# Patient Record
Sex: Female | Born: 1980 | Race: White | Hispanic: No | Marital: Single | Smoking: Former smoker
Health system: Southern US, Community
[De-identification: ages and names within clinical notes are randomized; demographics above are authoritative.]

## PROBLEM LIST (undated history)

## (undated) DIAGNOSIS — E079 Disorder of thyroid, unspecified: Secondary | ICD-10-CM

## (undated) DIAGNOSIS — D649 Anemia, unspecified: Secondary | ICD-10-CM

## (undated) DIAGNOSIS — J45909 Unspecified asthma, uncomplicated: Secondary | ICD-10-CM

## (undated) DIAGNOSIS — I10 Essential (primary) hypertension: Secondary | ICD-10-CM

## (undated) DIAGNOSIS — F909 Attention-deficit hyperactivity disorder, unspecified type: Secondary | ICD-10-CM

## (undated) DIAGNOSIS — T7840XA Allergy, unspecified, initial encounter: Secondary | ICD-10-CM

## (undated) HISTORY — DX: Essential (primary) hypertension: I10

## (undated) HISTORY — DX: Unspecified asthma, uncomplicated: J45.909

## (undated) HISTORY — DX: Disorder of thyroid, unspecified: E07.9

## (undated) HISTORY — PX: TONSILLECTOMY AND ADENOIDECTOMY: SHX28

## (undated) HISTORY — DX: Allergy, unspecified, initial encounter: T78.40XA

## (undated) HISTORY — DX: Anemia, unspecified: D64.9

## (undated) HISTORY — DX: Attention-deficit hyperactivity disorder, unspecified type: F90.9

---
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
## 1999-12-25 ENCOUNTER — Other Ambulatory Visit: Admission: RE | Admit: 1999-12-25 | Discharge: 1999-12-25 | Payer: Self-pay | Admitting: *Deleted

## 2001-04-01 ENCOUNTER — Other Ambulatory Visit: Admission: RE | Admit: 2001-04-01 | Discharge: 2001-04-01 | Payer: Self-pay | Admitting: *Deleted

## 2002-05-11 ENCOUNTER — Other Ambulatory Visit: Admission: RE | Admit: 2002-05-11 | Discharge: 2002-05-11 | Payer: Self-pay | Admitting: *Deleted

## 2004-08-04 ENCOUNTER — Other Ambulatory Visit: Admission: RE | Admit: 2004-08-04 | Discharge: 2004-08-04 | Payer: Self-pay | Admitting: Family Medicine

## 2005-01-24 ENCOUNTER — Encounter: Admission: RE | Admit: 2005-01-24 | Discharge: 2005-01-24 | Payer: Self-pay | Admitting: *Deleted

## 2005-10-28 ENCOUNTER — Emergency Department (HOSPITAL_COMMUNITY): Admission: EM | Admit: 2005-10-28 | Discharge: 2005-10-28 | Payer: Self-pay | Admitting: Emergency Medicine

## 2007-02-21 ENCOUNTER — Other Ambulatory Visit: Admission: RE | Admit: 2007-02-21 | Discharge: 2007-02-21 | Payer: Self-pay | Admitting: Obstetrics & Gynecology

## 2007-05-25 ENCOUNTER — Emergency Department (HOSPITAL_COMMUNITY): Admission: EM | Admit: 2007-05-25 | Discharge: 2007-05-25 | Payer: Self-pay | Admitting: Emergency Medicine

## 2007-07-21 ENCOUNTER — Emergency Department (HOSPITAL_COMMUNITY): Admission: EM | Admit: 2007-07-21 | Discharge: 2007-07-21 | Payer: Self-pay | Admitting: Emergency Medicine

## 2007-10-27 ENCOUNTER — Emergency Department (HOSPITAL_COMMUNITY): Admission: EM | Admit: 2007-10-27 | Discharge: 2007-10-27 | Payer: Self-pay | Admitting: Family Medicine
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
## 2010-04-02 ENCOUNTER — Emergency Department (HOSPITAL_COMMUNITY)
Admission: EM | Admit: 2010-04-02 | Discharge: 2010-04-02 | Payer: Self-pay | Source: Home / Self Care | Admitting: Emergency Medicine

## 2010-04-21 IMAGING — CR DG CHEST 2V
1 series · 2 of 2 positions shown · non-contrast
Comparison: NONE

CLINICAL DATA: Allergies. Asthma. 

CHEST TWO VIEW (PA AND LATERAL)

[Series 1: view not recorded · 0.17mm/px · 2 of 2 slices shown]
[im 1/2]
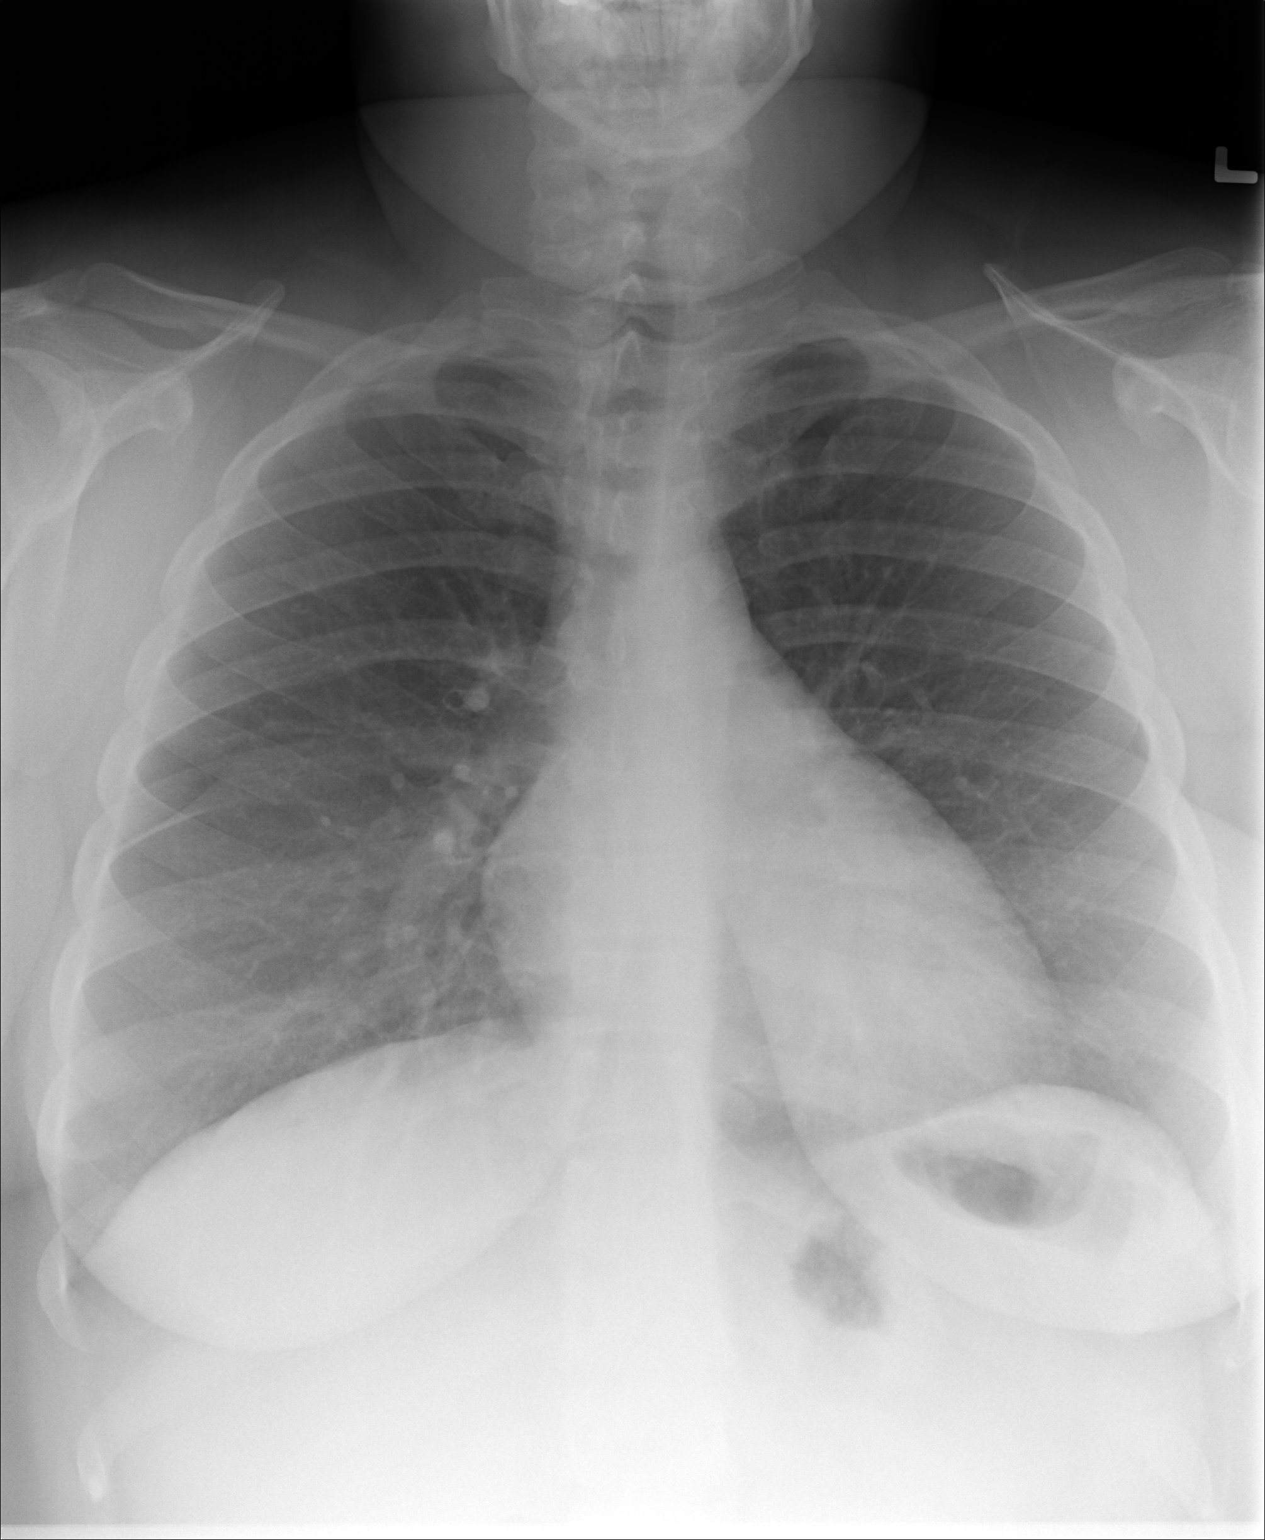
[im 2/2]
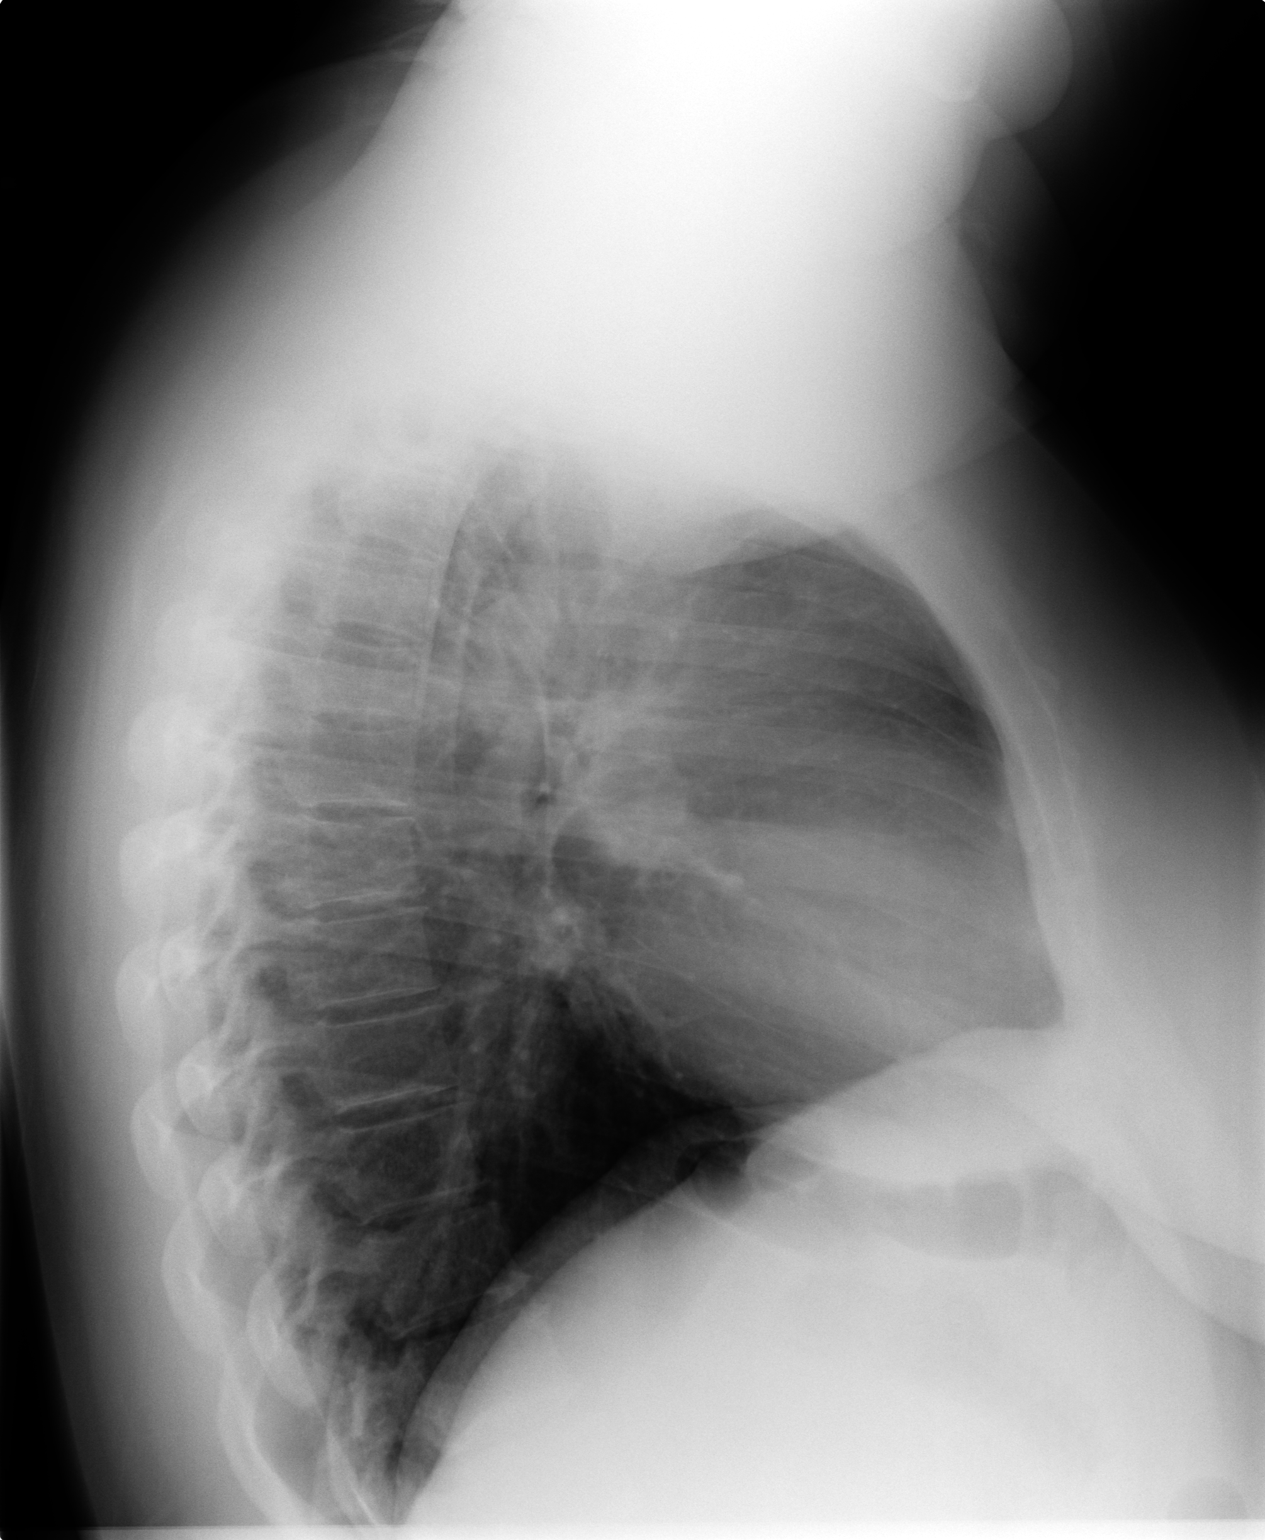

[2 of 2 positions shown; findings below may reference images not displayed]

FINDINGS: The lungs are clear and well expanded.   Heart and 
pulmonary vessels are normal.  No significant abnormalities are 
noted in the regional skeleton.
IMPRESSION: No active disease. Giti Delao M.D.

## 2010-12-29 LAB — DIFFERENTIAL
Lymphocytes Relative: 10 — ABNORMAL LOW
Lymphs Abs: 1.4
Neutro Abs: 12.1 — ABNORMAL HIGH
Neutrophils Relative %: 86 — ABNORMAL HIGH

## 2010-12-29 LAB — POCT PREGNANCY, URINE: Preg Test, Ur: NEGATIVE

## 2010-12-29 LAB — URINALYSIS, ROUTINE W REFLEX MICROSCOPIC
Bilirubin Urine: NEGATIVE
Ketones, ur: NEGATIVE
Specific Gravity, Urine: 1.005
Urobilinogen, UA: 0.2
pH: 6

## 2010-12-29 LAB — CBC
Platelets: 395
RBC: 4.18
WBC: 14.1 — ABNORMAL HIGH

## 2010-12-29 LAB — ETHANOL: Alcohol, Ethyl (B): 320 — ABNORMAL HIGH

## 2010-12-29 LAB — URINE MICROSCOPIC-ADD ON

## 2011-05-02 ENCOUNTER — Ambulatory Visit: Payer: Self-pay | Admitting: Internal Medicine

## 2011-05-11 ENCOUNTER — Telehealth: Payer: Self-pay

## 2011-05-11 NOTE — Telephone Encounter (Signed)
.  [facility]  PT IS REQUESTING REFILL ON ADDEROL 15 MG OR 20 MG WILL PICK UP TOMORROW  HAD TO RESCHEDULED THE APPT

## 2011-05-11 NOTE — Telephone Encounter (Signed)
Please tell pt to come in and see Dr. Merla Riches, cannot prescribe Adderall for her at this time, needs recheck.

## 2011-05-12 NOTE — Telephone Encounter (Signed)
Called and left message for patient to pick up at front desk. Per Dr Merla Riches Adderall 15mg  1 po q day

## 2011-05-14 ENCOUNTER — Encounter: Payer: Self-pay | Admitting: Family Medicine

## 2011-05-16 ENCOUNTER — Ambulatory Visit: Payer: Self-pay | Admitting: Internal Medicine

## 2011-05-16 ENCOUNTER — Encounter: Payer: Self-pay | Admitting: Internal Medicine

## 2011-05-16 ENCOUNTER — Ambulatory Visit (INDEPENDENT_AMBULATORY_CARE_PROVIDER_SITE_OTHER): Payer: PRIVATE HEALTH INSURANCE | Admitting: Internal Medicine

## 2011-05-16 DIAGNOSIS — F909 Attention-deficit hyperactivity disorder, unspecified type: Secondary | ICD-10-CM | POA: Insufficient documentation

## 2011-05-16 DIAGNOSIS — I10 Essential (primary) hypertension: Secondary | ICD-10-CM | POA: Insufficient documentation

## 2011-05-16 DIAGNOSIS — F988 Other specified behavioral and emotional disorders with onset usually occurring in childhood and adolescence: Secondary | ICD-10-CM

## 2011-05-16 MED ORDER — AMPHETAMINE-DEXTROAMPHET ER 20 MG PO CP24: 20.0000 mg | ORAL_CAPSULE | ORAL | Status: DC

## 2011-05-16 MED ORDER — BISOPROLOL-HYDROCHLOROTHIAZIDE 5-6.25 MG PO TABS: 1.0000 | ORAL_TABLET | Freq: Every day | ORAL | Status: DC

## 2011-05-16 MED ORDER — AMPHETAMINE-DEXTROAMPHETAMINE 15 MG PO TABS: 15.0000 mg | ORAL_TABLET | Freq: Every day | ORAL | Status: DC

## 2011-05-16 NOTE — Progress Notes (Signed)
  Subjective:    Patient ID: Emma Rojas, female    DOB: Jun 27, 1980, 31 y.o.   MRN: [id number]  HPISarah follows up for hypertension and ADD . Her blood pressures have been normal over the past 6 weeks at home. She is started back to the gym for workouts. She has lost a few pounds. She continues to take Ziac.  15 mg X. Are Adderall is no longer lasting as long as it should and she notices a waning of attention particularly in her busy job at [employer]. She has no side effects. She is now completely off Wellbutrin and doing well    Review of Systemsnoncontributory     Objective:   Physical Examstable vital signs and normal cardiovascular exam        Assessment & Plan:  Problem #1 ADD  Plan increase to Adderall 20XR for the next 3 months. If stable and responding well may call for 3 more months.  Problem #2 hypertension  Plan continue current meds and home monitoring  Problem #3 obesity  Plan continue workouts and diet

## 2011-05-17 ENCOUNTER — Other Ambulatory Visit: Payer: Self-pay | Admitting: Internal Medicine

## 2011-06-11 ENCOUNTER — Ambulatory Visit (INDEPENDENT_AMBULATORY_CARE_PROVIDER_SITE_OTHER): Payer: PRIVATE HEALTH INSURANCE | Admitting: Internal Medicine

## 2011-06-11 VITALS — BP 132/93 | HR 78 | Temp 98.6°F | Resp 16 | Ht 62.5 in | Wt 246.9 lb

## 2011-06-11 DIAGNOSIS — R05 Cough: Secondary | ICD-10-CM

## 2011-06-11 DIAGNOSIS — J9801 Acute bronchospasm: Secondary | ICD-10-CM

## 2011-06-11 DIAGNOSIS — R059 Cough, unspecified: Secondary | ICD-10-CM

## 2011-06-11 DIAGNOSIS — Z9109 Other allergy status, other than to drugs and biological substances: Secondary | ICD-10-CM

## 2011-06-11 DIAGNOSIS — Z789 Other specified health status: Secondary | ICD-10-CM

## 2011-06-11 MED ORDER — HYDROCODONE-ACETAMINOPHEN 7.5-500 MG/15ML PO SOLN: 5.0000 mL | Freq: Four times a day (QID) | ORAL | Status: AC | PRN

## 2011-06-11 MED ORDER — METHYLPREDNISOLONE ACETATE 80 MG/ML IJ SUSP
80.0000 mg | Freq: Once | INTRAMUSCULAR | Status: AC
  Administered 2011-06-11: 80 mg via INTRAMUSCULAR

## 2011-06-11 NOTE — Progress Notes (Signed)
  Subjective:    Patient ID: Emma Rojas, female    DOB: Jul 06, 1980, 31 y.o.   MRN: [id number]  HPI Cough Allergy No fever, not sick    Review of Systems     Objective:   Physical Exam Lungs clear all fields HEENT normal       Assessment & Plan:   Probable allergic cough  Lortab elixir Loratadine 10mg  Albuterol prn Depomedrol 80mg  IM

## 2011-07-04 ENCOUNTER — Telehealth: Payer: Self-pay | Admitting: *Deleted

## 2011-07-04 NOTE — Telephone Encounter (Signed)
We received a letter from pt stating that asks [place] to send a letter stating proof of prescription for Adderall XR 20mg  and it needed to be faxed to Dr Karie Fetch at [phone].  His regular phone number is [phone].  She needs it to include name, birthdate, and last four of social 63.  She needs this faxed over within the next 48 hours.  (NOTE NOT DATED AND IS IN CHART IF NEEDED)  Per Alycia Rossetti her last Rx from [place] was 04/2011.  Pt not currently receiving rx's from [place].  Please call pt to get details.  6:53pm  LMOM TO CB

## 2011-07-05 NOTE — Telephone Encounter (Signed)
There is documentation under "Meds" tab in Epic that pt has current Rxs written by Dr Merla Riches through Apr, 2013. Can we write her a letter to fax?

## 2011-07-06 NOTE — Telephone Encounter (Signed)
[facility] notifying patient that letter was faxed to number listed below.

## 2011-07-06 NOTE — Telephone Encounter (Signed)
Letter written and ready to fax. Letter at [employer].  Call pt to notify.

## 2011-08-21 ENCOUNTER — Telehealth: Payer: Self-pay

## 2011-08-21 DIAGNOSIS — F909 Attention-deficit hyperactivity disorder, unspecified type: Secondary | ICD-10-CM

## 2011-08-21 MED ORDER — AMPHETAMINE-DEXTROAMPHET ER 20 MG PO CP24: 20.0000 mg | ORAL_CAPSULE | ORAL | Status: DC

## 2011-08-21 NOTE — Telephone Encounter (Signed)
adderall printed and ready to p/up

## 2011-08-21 NOTE — Telephone Encounter (Signed)
RX ran out in April but she didn't call to renew due to no health insurance.  Now covered and would like to refill Adderall RX.  Best number is new -- [phone]

## 2011-08-22 NOTE — Telephone Encounter (Signed)
Pt notified that rx is ready for pick up

## 2011-08-24 ENCOUNTER — Telehealth: Payer: Self-pay

## 2011-08-24 NOTE — Telephone Encounter (Signed)
Pt brought printed Rx back to 102 and it is at the PA desk. Can we write the Rx for the immediate release Adderall BID?

## 2011-08-24 NOTE — Telephone Encounter (Signed)
Pt picked up rx adderall rx on the 15th and she would like to bring rx back for a rx adderall 2 per day without insurance its only $50 and the other rx is $75

## 2011-08-25 MED ORDER — AMPHETAMINE-DEXTROAMPHETAMINE 20 MG PO TABS: 20.0000 mg | ORAL_TABLET | Freq: Two times a day (BID) | ORAL | Status: DC

## 2011-08-25 NOTE — Telephone Encounter (Signed)
Original Rx brought back in from 08/21/11. New Rx issued for Adderall 20 mg bid. Original Rx shredded.

## 2011-08-25 NOTE — Telephone Encounter (Signed)
Patient notified rx in pickup drawer.

## 2011-10-16 ENCOUNTER — Telehealth: Payer: Self-pay

## 2011-10-16 NOTE — Telephone Encounter (Signed)
Pt is calling for a refill on adderol. States last time refilled was for regular adderol because she did not have health ins. Has ins now and wants to go back to the "xr" Please call pt to advise

## 2011-10-17 MED ORDER — AMPHETAMINE-DEXTROAMPHET ER 20 MG PO CP24: 20.0000 mg | ORAL_CAPSULE | ORAL | Status: DC

## 2011-10-17 NOTE — Telephone Encounter (Signed)
LMOM that received her request and it is ready for p/up

## 2011-10-17 NOTE — Telephone Encounter (Signed)
Done and printed. Changed back to Adderall 20 mg XR.

## 2011-11-14 ENCOUNTER — Telehealth: Payer: Self-pay

## 2011-11-14 MED ORDER — AMPHETAMINE-DEXTROAMPHET ER 20 MG PO CP24: 20.0000 mg | ORAL_CAPSULE | ORAL | Status: DC

## 2011-11-14 NOTE — Telephone Encounter (Signed)
Advised pt of rx ready at front desk for pick up.

## 2011-11-14 NOTE — Telephone Encounter (Signed)
Pt is requesting a written rx for adderall please call [phone] when ready to pick up

## 2011-11-14 NOTE — Telephone Encounter (Signed)
Signed and at TL desk  

## 2011-12-19 ENCOUNTER — Telehealth: Payer: Self-pay

## 2011-12-19 MED ORDER — AMPHETAMINE-DEXTROAMPHET ER 20 MG PO CP24: 20.0000 mg | ORAL_CAPSULE | ORAL | Status: DC

## 2011-12-19 NOTE — Telephone Encounter (Signed)
? 

## 2011-12-19 NOTE — Telephone Encounter (Signed)
Notified pt that Rx is ready for p/up and that she is due for f/up for add'l RFs. Pt agreed.

## 2011-12-19 NOTE — Telephone Encounter (Signed)
Pt needs refill on her adderall please call patient at [phone]

## 2011-12-19 NOTE — Telephone Encounter (Signed)
Refill done and printed. Needs OV for further refill.

## 2012-01-22 ENCOUNTER — Encounter: Payer: Self-pay | Admitting: Family Medicine

## 2012-05-09 ENCOUNTER — Other Ambulatory Visit: Payer: Self-pay | Admitting: Internal Medicine

## 2012-06-15 ENCOUNTER — Other Ambulatory Visit: Payer: Self-pay | Admitting: Internal Medicine

## 2012-07-23 ENCOUNTER — Ambulatory Visit: Payer: PRIVATE HEALTH INSURANCE | Admitting: Internal Medicine

## 2012-09-19 ENCOUNTER — Ambulatory Visit (INDEPENDENT_AMBULATORY_CARE_PROVIDER_SITE_OTHER): Payer: BC Managed Care – PPO | Admitting: Internal Medicine

## 2012-09-19 VITALS — BP 120/85 | HR 72 | Temp 98.4°F | Resp 16 | Wt 286.0 lb

## 2012-09-19 DIAGNOSIS — Z6841 Body Mass Index (BMI) 40.0 and over, adult: Secondary | ICD-10-CM

## 2012-09-19 DIAGNOSIS — F988 Other specified behavioral and emotional disorders with onset usually occurring in childhood and adolescence: Secondary | ICD-10-CM

## 2012-09-19 DIAGNOSIS — F909 Attention-deficit hyperactivity disorder, unspecified type: Secondary | ICD-10-CM

## 2012-09-19 DIAGNOSIS — I10 Essential (primary) hypertension: Secondary | ICD-10-CM

## 2012-09-19 LAB — POCT CBC
Hemoglobin: 13.5 g/dL (ref 12.2–16.2)
Lymph, poc: 2 (ref 0.6–3.4)
MCH, POC: 29.9 pg (ref 27–31.2)
MCHC: 31.2 g/dL — AB (ref 31.8–35.4)
MID (cbc): 0.7 (ref 0–0.9)
MPV: 7.7 fL (ref 0–99.8)
POC LYMPH PERCENT: 16.1 %L (ref 10–50)
POC MID %: 5.5 %M (ref 0–12)
Platelet Count, POC: 385 10*3/uL (ref 142–424)
RDW, POC: 13.6 %
WBC: 12.7 10*3/uL — AB (ref 4.6–10.2)

## 2012-09-19 MED ORDER — AMPHETAMINE-DEXTROAMPHET ER 20 MG PO CP24: 20.0000 mg | ORAL_CAPSULE | ORAL | Status: DC

## 2012-09-19 MED ORDER — BISOPROLOL-HYDROCHLOROTHIAZIDE 5-6.25 MG PO TABS: ORAL_TABLET | ORAL | Status: DC

## 2012-09-19 NOTE — Progress Notes (Signed)
  Subjective:    Patient ID: Emma Rojas, female    DOB: 26-Mar-1981, 32 y.o.   MRN: [id number]  HPIhere for f/u Long hx ADD Off meds due to anxiety they created plus job change no longer demanded focusBUT now with new job travelling w/ Rosanne [employer]ing [employer] is in need of meds again Lots of distractability and lack of organization Fatigued w/ all the travel 249 to [place] since 2/13/big appetite/no exercise  Fh-+htn  Still on ziac doing well  Review of Systems  Constitutional: Negative for fever.  Eyes: Negative for visual disturbance.  Respiratory: Negative for shortness of breath.   Cardiovascular: Negative for chest pain, palpitations and leg swelling.  Gastrointestinal: Negative for abdominal pain.  Endocrine: Negative for cold intolerance, heat intolerance, polydipsia, polyphagia and polyuria.  Genitourinary: Negative for difficulty urinating.  Neurological: Negative for weakness and headaches.  Hematological: Does not bruise/bleed easily.  Psychiatric/Behavioral: Negative for behavioral problems, sleep disturbance and dysphoric mood. The patient is not nervous/anxious.        Objective:   Physical Exam BP 120/85  Pulse 72  Temp(Src) 98.4 F (36.9 C) (Oral)  Resp 16  Wt 286 lb (129.729 kg)  BMI 51.44 kg/m2  LMP 08/29/2012 HEENT clear No thyromegaly Heart regular without murmur Extremities without edema Neurological intact Mood stable/affect appropriate       Assessment & Plan:  Hypertension - Plan: POCT CBC, Comprehensive metabolic panel, bisoprolol-hydrochlorothiazide (ZIAC) 5-6.25 MG per tablet  Attention deficit hyperactivity disorder (ADHD)  BMI 50.0-59.9, adult - Plan: POCT glycosylated hemoglobin (Hb A1C), Lipid panel, TSH  Meds ordered this encounter  Medications  . amphetamine-dextroamphetamine (ADDERALL XR) 20 MG 24 hr capsule    Sig: Take 1 capsule (20 mg total) by mouth every morning.    Dispense:  30 capsule    Refill:  0  . amphetamine-dextroamphetamine  (ADDERALL XR) 20 MG 24 hr capsule    Sig: Take 1 capsule (20 mg total) by mouth every morning. 10/19/12    Dispense:  30 capsule    Refill:  0  . amphetamine-dextroamphetamine (ADDERALL XR) 20 MG 24 hr capsule    Sig: Take 1 capsule (20 mg total) by mouth every morning. 11/19/12    Dispense:  30 capsule    Refill:  0  . bisoprolol-hydrochlorothiazide (ZIAC) 5-6.25 MG per tablet    Sig: One daily    Dispense:  90 tablet    Refill:  3   Call for side effects Send letter after lab results to establish diet and exercise Recheck 3-6 months

## 2012-09-20 LAB — COMPREHENSIVE METABOLIC PANEL
AST: 28 U/L (ref 0–37)
BUN: 10 mg/dL (ref 6–23)
Calcium: 9.8 mg/dL (ref 8.4–10.5)
Chloride: 100 mEq/L (ref 96–112)
Creat: 0.75 mg/dL (ref 0.50–1.10)

## 2012-09-20 LAB — LIPID PANEL
HDL: 57 mg/dL (ref >39)
Total CHOL/HDL Ratio: 3.7 Ratio

## 2012-09-20 LAB — TSH: TSH: 2.634 u[IU]/mL (ref 0.350–4.500)

## 2012-09-23 ENCOUNTER — Encounter: Payer: Self-pay | Admitting: Internal Medicine

## 2012-12-03 ENCOUNTER — Ambulatory Visit (INDEPENDENT_AMBULATORY_CARE_PROVIDER_SITE_OTHER): Payer: BC Managed Care – PPO | Admitting: Family Medicine

## 2012-12-03 VITALS — BP 116/72 | HR 80 | Temp 98.8°F | Resp 16 | Ht 63.5 in | Wt 281.4 lb

## 2012-12-03 DIAGNOSIS — L02219 Cutaneous abscess of trunk, unspecified: Secondary | ICD-10-CM

## 2012-12-03 DIAGNOSIS — R109 Unspecified abdominal pain: Secondary | ICD-10-CM

## 2012-12-03 MED ORDER — DOXYCYCLINE HYCLATE 100 MG PO TABS: 100.0000 mg | ORAL_TABLET | Freq: Two times a day (BID) | ORAL | Status: DC

## 2012-12-03 NOTE — Progress Notes (Signed)
Procedure Note: Verbal consent obtained.  Local anesthesia with 2 cc 2% lidocaine.  Betadine prep.  Incision with 11 blade.  Small amount purulence expressed.  Very small amount of sebaceous material also expressed.  Wound irrigated with remaining anesthetic.  Packed with 1/4 inch plain packing.  Cleansed and dressed.  Discussed wound care.  Recheck 48 hours . Pt tolerated very well.

## 2012-12-03 NOTE — Progress Notes (Signed)
32 year old woman who is in [employer] and is planning on traveling to [facility] today. He's had a couple days of progressive swelling on the right flank Tawni Pummel by redness and fluctuance. She's urged to come here today by her mother who works with Dr. Dorma Russell.  Objective: Patient has a 1 cm bullous lesion on her right flank which is red, tender, and fluctuant.  Assessment: Abscess right flank  Plan:Cellulitis and abscess of trunk - Plan: doxycycline (VIBRA-TABS) 100 MG tablet, Wound culture  Signed, Elvina Sidle, MD

## 2012-12-05 ENCOUNTER — Ambulatory Visit (INDEPENDENT_AMBULATORY_CARE_PROVIDER_SITE_OTHER): Payer: BC Managed Care – PPO | Admitting: Physician Assistant

## 2012-12-05 VITALS — BP 128/74 | HR 75 | Temp 98.6°F | Resp 18 | Ht 63.5 in | Wt 281.0 lb

## 2012-12-05 DIAGNOSIS — L02219 Cutaneous abscess of trunk, unspecified: Secondary | ICD-10-CM

## 2012-12-05 DIAGNOSIS — J14 Pneumonia due to Hemophilus influenzae: Secondary | ICD-10-CM

## 2012-12-05 LAB — WOUND CULTURE
Gram Stain: NONE SEEN
Gram Stain: NONE SEEN
Gram Stain: NONE SEEN
Organism ID, Bacteria: NO GROWTH

## 2012-12-05 NOTE — Progress Notes (Signed)
  Subjective:    Patient ID: Emma Rojas, female    DOB: [birth year], 32 y.o.   MRN: [id number]  HPI   Emma Rojas is a very pleasant 32 yr old female here for follow up on an abscess that was drained here two days ago.  Pt states she is doing well.  Did have some pain at the site yesterday but feel better today.  Doxycycline does cause some nausea, but it is not unbearable.  She is changing the dressing.  The area continues to drain    Review of Systems  Constitutional: Negative.   HENT: Negative.   Respiratory: Negative.   Cardiovascular: Negative.   Gastrointestinal: Positive for nausea.  Musculoskeletal: Negative.   Skin: Positive for wound.  Neurological: Negative.        Objective:   Physical Exam  Vitals reviewed. Constitutional: She is oriented to person, place, and time. She appears well-developed and well-nourished. No distress.  HENT:  Head: Normocephalic and atraumatic.  Eyes: Conjunctivae are normal. No scleral icterus.  Pulmonary/Chest: Effort normal.  Neurological: She is alert and oriented to person, place, and time.  Skin: Skin is warm and dry.     Healing wound at right hip/abdomen; small amount of surrounding erythema, no induration; packing removed, unable to express any drainage from the wound  Psychiatric: She has a normal mood and affect. Her behavior is normal.       Assessment & Plan:  Cellulitis and abscess of trunk   Emma Rojas is a 32 yr old female here for follow up on an abscess drained here 48 hours ago.  The area appears to be doing well.  I was unable to express any further purulence or sebaceous material from the wound.  I do not think she requires further packing.  Keep the area covered until completely healed.  Continue doxy for now - cx currently with no growth but not yet finalized.  If neg, may be able to stop doxy.  No need for further follow up unless worsening or concerns arise

## 2013-02-06 ENCOUNTER — Encounter: Payer: BC Managed Care – PPO | Admitting: Gynecology

## 2013-02-06 ENCOUNTER — Telehealth: Payer: Self-pay | Admitting: Nurse Practitioner

## 2013-02-06 ENCOUNTER — Telehealth: Payer: Self-pay | Admitting: Gynecology

## 2013-02-06 ENCOUNTER — Ambulatory Visit: Payer: BC Managed Care – PPO | Admitting: Gynecology

## 2013-02-06 NOTE — Telephone Encounter (Signed)
Patient had a work emergency and had to cancel her appt for today. Rescheduled her for 03/02/13 with patty

## 2013-02-06 NOTE — Telephone Encounter (Signed)
Patient called and had a work emergency and had to reschedule her appt for day we rescheduled her for 03/02/13 with patty.

## 2013-03-02 ENCOUNTER — Ambulatory Visit (INDEPENDENT_AMBULATORY_CARE_PROVIDER_SITE_OTHER): Payer: BC Managed Care – PPO | Admitting: Nurse Practitioner

## 2013-03-02 ENCOUNTER — Encounter: Payer: Self-pay | Admitting: Nurse Practitioner

## 2013-03-02 VITALS — BP 122/80 | HR 72 | Resp 16 | Ht 63.0 in | Wt 285.0 lb

## 2013-03-02 DIAGNOSIS — Z0000 Encounter for general adult medical examination without abnormal findings: Secondary | ICD-10-CM

## 2013-03-02 DIAGNOSIS — Z01419 Encounter for gynecological examination (general) (routine) without abnormal findings: Secondary | ICD-10-CM

## 2013-03-02 DIAGNOSIS — Z113 Encounter for screening for infections with a predominantly sexual mode of transmission: Secondary | ICD-10-CM

## 2013-03-02 LAB — POCT URINALYSIS DIPSTICK
Bilirubin, UA: NEGATIVE
Blood, UA: NEGATIVE
Glucose, UA: NEGATIVE
Leukocytes, UA: NEGATIVE
Nitrite, UA: NEGATIVE

## 2013-03-02 NOTE — Patient Instructions (Signed)

## 2013-03-02 NOTE — Progress Notes (Signed)
Patient ID: Emma Rojas, female   DOB: 01-16-1981, 32 y.o.   MRN: [id number] 32 y.o. G0P0 Single Caucasian Fe here for annual exam.  Menses now at 5 days, flow moderate to light.  Some cramps sometimes every month. migraine history but better now.  In earlier years took Ortho tri cyclen - causing elevated BP.   Currently not sexually active. Female partner is preference.  Patient's last menstrual period was 02/16/2013.          Sexually active: yes  The current method of family planning is none.    Exercising: no  The patient does not participate in regular exercise at present. Smoker:  Former, quit 5 years ago  Health Maintenance: Pap:  5-6 years ago, no history of abnormal TDaP:  UTD Gardasil: did not get. Labs: HB: 12.6 Urine: negative, pH 7.0   reports that she quit smoking about 5 years ago. She does not have any smokeless tobacco history on file. She reports that she drinks about 2.5 ounces of alcohol per week. She reports that she does not use illicit drugs.  Past Medical History  Diagnosis Date  . Hypertension   . ADD (attention deficit disorder with hyperactivity)   . Allergy   . Asthma     Past Surgical History  Procedure Laterality Date  . Tonsillectomy and adenoidectomy      Current Outpatient Prescriptions  Medication Sig Dispense Refill  . b complex vitamins tablet Take 1 tablet by mouth daily.      . bisoprolol-hydrochlorothiazide (ZIAC) 5-6.25 MG per tablet One daily  90 tablet  3   No current facility-administered medications for this visit.    Family History  Problem Relation Age of Onset  . Prostate cancer Father     ROS:  Pertinent items are noted in HPI.  Otherwise, a comprehensive ROS was negative.  Exam:   BP 122/80  Pulse 72  Resp 16  Ht 5\' 3"  (1.6 m)  Wt 285 lb (129.275 kg)  BMI 50.50 kg/m2  LMP 02/16/2013 Height: 5\' 3"  (160 cm)  Ht Readings from Last 3 Encounters:  03/02/13 5\' 3"  (1.6 m)  12/05/12 5' 3.5" (1.613 m)  12/03/12 5' 3.5"  (1.613 m)    General appearance: alert, cooperative and appears stated age Head: Normocephalic, without obvious abnormality, atraumatic Neck: no adenopathy, supple, symmetrical, trachea midline and thyroid normal to inspection and palpation Lungs: clear to auscultation bilaterally Breasts: normal appearance, no masses or tenderness Heart: regular rate and rhythm Abdomen: soft, non-tender; no masses,  no organomegaly Extremities: extremities normal, atraumatic, no cyanosis or edema Skin: Skin color, texture, turgor normal. No rashes or lesions Lymph nodes: Cervical, supraclavicular, and axillary nodes normal. No abnormal inguinal nodes palpated Neurologic: Grossly normal   Pelvic: External genitalia:  no lesions              Urethra:  normal appearing urethra with no masses, tenderness or lesions              Bartholin's and Skene's: normal                 Vagina: normal appearing vagina with normal color and discharge, no lesions              Cervix: anteverted              Pap taken: yes Bimanual Exam:  Uterus:  normal size, contour, position, consistency, mobility, non-tender  Adnexa: no mass, fullness, tenderness               Rectovaginal: Confirms               Anus:  normal sphincter tone, no lesions  A:  Well Woman with normal exam  Female partner - not currently dating  History of HTN - past use OCP caused elevated BP  History of ADD - off med's  Obese - BMI of 50.49 - no history of PCOS  P:   Pap smear as per guidelines   Follow with lab results  Counseled on breast self exam, STD prevention, adequate intake of calcium and vitamin D, diet and exercise return annually or prn  An After Visit Summary was printed and given to the patient.  Previous patient here about 5 years ago, Dr. Hyacinth Meeker

## 2013-03-03 LAB — STD PANEL: Hepatitis B Surface Ag: NEGATIVE

## 2013-03-04 LAB — IPS N GONORRHOEA AND CHLAMYDIA BY PCR

## 2013-03-04 NOTE — Progress Notes (Signed)
Encounter reviewed by Dr. Yelena Metzer Silva.  

## 2013-03-06 LAB — IPS PAP TEST WITH HPV

## 2013-03-10 ENCOUNTER — Telehealth: Payer: Self-pay | Admitting: *Deleted

## 2013-03-10 NOTE — Telephone Encounter (Signed)
I have attempted to contact this patient by phone with the following results: left message to return my call on answering machine (mobile/home). 

## 2013-03-10 NOTE — Telephone Encounter (Signed)
Message copied by Luisa Dago on Tue Mar 10, 2013  4:45 PM ------      Message from: Ria Comment R      Created: Mon Mar 09, 2013  8:56 AM       Let patient know that STD's are negative and pap is normal ------

## 2013-03-11 NOTE — Telephone Encounter (Signed)
Pt notified by Tracy Fast. 

## 2013-05-18 ENCOUNTER — Ambulatory Visit (INDEPENDENT_AMBULATORY_CARE_PROVIDER_SITE_OTHER): Payer: BC Managed Care – PPO | Admitting: Family Medicine

## 2013-05-18 VITALS — BP 130/84 | HR 96 | Temp 98.4°F | Resp 16 | Ht 63.5 in | Wt 294.0 lb

## 2013-05-18 DIAGNOSIS — J020 Streptococcal pharyngitis: Secondary | ICD-10-CM

## 2013-05-18 DIAGNOSIS — J029 Acute pharyngitis, unspecified: Secondary | ICD-10-CM

## 2013-05-18 DIAGNOSIS — R509 Fever, unspecified: Secondary | ICD-10-CM

## 2013-05-18 LAB — POCT INFLUENZA A/B
Influenza A, POC: NEGATIVE
Influenza B, POC: NEGATIVE

## 2013-05-18 LAB — POCT RAPID STREP A (OFFICE): Rapid Strep A Screen: POSITIVE — AB

## 2013-05-18 MED ORDER — CEFDINIR 300 MG PO CAPS: 300.0000 mg | ORAL_CAPSULE | Freq: Two times a day (BID) | ORAL | Status: DC

## 2013-05-18 MED ORDER — HYDROCODONE-HOMATROPINE 5-1.5 MG/5ML PO SYRP: 5.0000 mL | ORAL_SOLUTION | Freq: Three times a day (TID) | ORAL | Status: DC | PRN

## 2013-05-18 NOTE — Patient Instructions (Signed)

## 2013-05-18 NOTE — Progress Notes (Signed)
Chief Complaint:  Chief Complaint  Patient presents with  . Sore Throat    PND symptoms since 2 am  . Cough    HPI: Emma Rojas is a 33 y.o. female who is here for congestion and sore throat started last night. Hurts to drink water. Some fever last with chills and sweats. Did not take temperature to confirm fever. Has not taken any otc medications. Where her nose and throat hurts. Had fevers and chills. Ears feels itchy,  Minimal facial pain. Did not get flu vaccine.  Was SOb walking upstaurs but no wheezing. Cough , stuck in throat.  Had fever and chills last night, tried otc meds without releif  Past Medical History  Diagnosis Date  . Hypertension   . ADD (attention deficit disorder with hyperactivity)   . Allergy   . Asthma    Past Surgical History  Procedure Laterality Date  . Tonsillectomy and adenoidectomy     History   Social History  . Marital Status: Single    Spouse Name: N/A    Number of Children: 0  . Years of Education: N/A   Occupational History  .     Social History Main Topics  . Smoking status: Former Smoker    Quit date: 03/02/2008  . Smokeless tobacco: Not on file  . Alcohol Use: 2.5 oz/week    5 drink(s) per week  . Drug Use: No  . Sexual Activity: Yes    Partners: Female   Other Topics Concern  . Not on file   Social History Narrative  . No narrative on file   Family History  Problem Relation Age of Onset  . Prostate cancer Father    Allergies  Allergen Reactions  . Amoxicillin Hives   Prior to Admission medications   Medication Sig Start Date End Date Taking? Authorizing Provider  b complex vitamins tablet Take 1 tablet by mouth daily.   Yes Historical Provider, MD  bisoprolol-hydrochlorothiazide [facility]) 5-6.25 MG per tablet One daily 09/19/12  Yes Tonye Pearson, MD     ROS: The patient denies  night sweats, unintentional weight loss, chest pain, palpitations, wheezing, dyspnea on exertion, nausea, vomiting,  abdominal pain, dysuria, hematuria, melena, numbness, weakness, or tingling.   All other systems have been reviewed and were otherwise negative with the exception of those mentioned in the HPI and as above.    PHYSICAL EXAM: Filed Vitals:   05/18/13 0842  BP: 130/84  Pulse: 96  Temp: 98.4 F (36.9 C)  Resp: 16   Filed Vitals:   05/18/13 0842  Height: 5' 3.5" (1.613 m)  Weight: 294 lb (133.358 kg)   Body mass index is 51.26 kg/(m^2).  General: Alert, no acute distress HEENT:  Normocephalic, atraumatic, oropharynx patent. EOMI, PERRLA, erythematous throat, uvula slightly edematous, no tonsils.  Cardiovascular:  Regular rate and rhythm, no rubs murmurs or gallops.  No Carotid bruits, radial pulse intact. No pedal edema.  Respiratory: Clear to auscultation bilaterally.  No wheezes, rales, or rhonchi.  No cyanosis, no use of accessory musculature GI: No organomegaly, abdomen is soft and non-tender, positive bowel sounds.  No masses. Skin: No rashes. Neurologic: Facial musculature symmetric. Psychiatric: Patient is appropriate throughout our interaction. Lymphatic: No cervical lymphadenopathy Musculoskeletal: Gait intact.   LABS: Results for orders placed in visit on 05/18/13  POCT RAPID STREP A (OFFICE)      Result Value Range   Rapid Strep A Screen Positive (*) Negative  POCT  INFLUENZA A/B      Result Value Range   Influenza A, POC Negative     Influenza B, POC Negative       EKG/XRAY:   [facility]   ASSESSMENT/PLAN: Encounter Diagnoses  Name Primary?  . Fever Yes  . Sore throat   . Acute pharyngitis   . Strep pharyngitis     Rx Omnicef Rx hycodan F/u prn Gross sideeffects, risk and benefits, and alternatives of medications d/w patient. Patient is aware that all medications have potential sideeffects and we are unable to predict every sideeffect or drug-drug interaction that may occur.  LE, THAO PHUONG, DO 05/18/2013 10:54  AM

## 2013-07-24 ENCOUNTER — Encounter: Payer: Self-pay | Admitting: Internal Medicine

## 2013-07-24 ENCOUNTER — Ambulatory Visit (INDEPENDENT_AMBULATORY_CARE_PROVIDER_SITE_OTHER): Payer: BC Managed Care – PPO | Admitting: Internal Medicine

## 2013-07-24 VITALS — BP 118/72 | HR 70 | Temp 98.1°F | Resp 16 | Ht 63.25 in | Wt 290.8 lb

## 2013-07-24 DIAGNOSIS — F909 Attention-deficit hyperactivity disorder, unspecified type: Secondary | ICD-10-CM

## 2013-07-24 DIAGNOSIS — I10 Essential (primary) hypertension: Secondary | ICD-10-CM

## 2013-07-24 DIAGNOSIS — Z6841 Body Mass Index (BMI) 40.0 and over, adult: Secondary | ICD-10-CM

## 2013-07-24 MED ORDER — LISDEXAMFETAMINE DIMESYLATE 50 MG PO CAPS: 50.0000 mg | ORAL_CAPSULE | Freq: Every day | ORAL | Status: DC

## 2013-07-24 MED ORDER — LISDEXAMFETAMINE DIMESYLATE 50 MG PO CAPS
50.0000 mg | ORAL_CAPSULE | Freq: Every day | ORAL | Status: DC
Start: 2013-07-24 — End: 2013-11-11

## 2013-07-24 NOTE — Progress Notes (Signed)
This chart was scribed for [employer] P. Merla Richesoolittle, MD by Beverly MilchJ Harrison Collins, ED Scribe. This patient was seen in room 14 and the patient's care was started at 9:01 AM.  Subjective:    Patient ID: Emma Rojas, female    DOB: 1980-11-17, 33 y.o.   MRN: [id number]  HPI HPI Comments: Emma Rojas is a 33 y.o. female who presents to the [facility] for a med refill. She states she stopped taking her adderall because she believe she was on too high of a dose. She states she was on the 20 mg adderall XR. She reports being jittery and feeling as if "she had no personality." She states she's stopped drinking coffee, but does drink a small amount of tea. Pt also reports she would like to consider Vyvanse for her ADHD.   Pt reports her blood pressure has been great. She states it was good today (118/20 self reported). Pt reports she stopped taking her BP meds because she didn't feel as though she needed them anymore with her current diet and exercise regimen. She states she started taking kickboxing classes to get into shape about a month ago and would like to manage her high BP without meds.    Review of Systems  Constitutional: Negative for activity change, appetite change and unexpected weight change.  Musculoskeletal: Negative for arthralgias and myalgias.  Neurological: Negative for headaches.  All other systems reviewed and are negative.      Objective:   Physical Exam  Nursing note and vitals reviewed. Constitutional: She is oriented to person, place, and time. She appears well-developed and well-nourished. No distress.  HENT:  Head: Normocephalic and atraumatic.  Eyes: EOM are normal. Pupils are equal, round, and reactive to light. Right eye exhibits no discharge. Left eye exhibits no discharge. No scleral icterus.  Cardiovascular: Normal rate, regular rhythm and normal heart sounds.  Exam reveals no gallop and no friction rub.   No murmur heard. Pulmonary/Chest:  Effort normal.  Musculoskeletal: She exhibits no edema.  Neurological: She is alert and oriented to person, place, and time. No cranial nerve deficit (no gross deficits).  Skin: Skin is warm and dry.  Psychiatric: She has a normal mood and affect. Her behavior is normal.   Filed Vitals:   07/24/13 0828  BP: 118/72  Pulse: 70  Temp: 98.1 F (36.7 C)  TempSrc: Oral  Resp: 16  Height: 5' 3.25" (1.607 m)  Weight: 290 lb 12.8 oz (131.906 kg)  SpO2: 99%        Assessment & Plan:   DIAGNOSTIC STUDIES: Oxygen Saturation is 99% on RA, normal by my interpretation.    COORDINATION OF CARE: Pt advised of plan for treatment and pt agrees.  Hypertension-still may be due to meds vs wt  Attention deficit hyperactivity disorder (ADHD)--restart meds w/ vyv 50--my chart for f/u  BMI 50.0-59.9, adult-kickbox!  Meds ordered this encounter  Medications  . lisdexamfetamine (VYVANSE) 50 MG capsule    Sig: Take 1 capsule (50 mg total) by mouth daily.    Dispense:  30 capsule    Refill:  0  . lisdexamfetamine (VYVANSE) 50 MG capsule    Sig: Take 1 capsule (50 mg total) by mouth daily. For 30 d after date signed    Dispense:  30 capsule    Refill:  0  . lisdexamfetamine (VYVANSE) 50 MG capsule    Sig: Take 1 capsule (50 mg total) by mouth daily. For 60 d  after date signed    Dispense:  30 capsule    Refill:  0    I have completed the patient encounter in its entirety as documented by the scribe, with editing by me where necessary. Marleny Faller P. Merla Richesoolittle, M.D.

## 2013-10-06 ENCOUNTER — Other Ambulatory Visit: Payer: Self-pay | Admitting: Internal Medicine

## 2013-10-23 ENCOUNTER — Telehealth: Payer: Self-pay

## 2013-10-23 MED ORDER — LISDEXAMFETAMINE DIMESYLATE 50 MG PO CAPS: 50.0000 mg | ORAL_CAPSULE | Freq: Every day | ORAL | Status: DC

## 2013-10-23 NOTE — Telephone Encounter (Signed)
Dr. Merla Richesoolittle  lisdexamfetamine (VYVANSE) 50 MG capsule  refill  [phone]

## 2013-10-23 NOTE — Telephone Encounter (Signed)
Rx printed. Patient needs OV with Dr. Merla Richesoolittle for additional prescriptions this med.  Meds ordered this encounter  Medications  . lisdexamfetamine (VYVANSE) 50 MG capsule    Sig: Take 1 capsule (50 mg total) by mouth daily.    Dispense:  30 capsule    Refill:  0    Order Specific Question:  Supervising Provider    Answer:  DOOLITTLE, ROBERT P [[id number]]

## 2013-10-24 NOTE — Telephone Encounter (Signed)
Left message on machine to call back. Rx up front for p/u 

## 2013-10-25 NOTE — Telephone Encounter (Signed)
Pt.notified

## 2013-11-11 ENCOUNTER — Telehealth: Payer: Self-pay | Admitting: Nurse Practitioner

## 2013-11-11 ENCOUNTER — Ambulatory Visit (INDEPENDENT_AMBULATORY_CARE_PROVIDER_SITE_OTHER): Payer: BC Managed Care – PPO | Admitting: Certified Nurse Midwife

## 2013-11-11 ENCOUNTER — Encounter: Payer: Self-pay | Admitting: Certified Nurse Midwife

## 2013-11-11 VITALS — BP 94/60 | HR 64 | Resp 16 | Ht 63.0 in | Wt 275.0 lb

## 2013-11-11 DIAGNOSIS — N925 Other specified irregular menstruation: Secondary | ICD-10-CM

## 2013-11-11 DIAGNOSIS — N920 Excessive and frequent menstruation with regular cycle: Secondary | ICD-10-CM

## 2013-11-11 DIAGNOSIS — N949 Unspecified condition associated with female genital organs and menstrual cycle: Secondary | ICD-10-CM

## 2013-11-11 DIAGNOSIS — N938 Other specified abnormal uterine and vaginal bleeding: Secondary | ICD-10-CM

## 2013-11-11 LAB — CBC
HCT: 35.7 % — ABNORMAL LOW (ref 36.0–46.0)
HEMOGLOBIN: 12.2 g/dL (ref 12.0–15.0)
MCH: 30.6 pg (ref 26.0–34.0)
MCHC: 34.2 g/dL (ref 30.0–36.0)
MCV: 89.5 fL (ref 78.0–100.0)
Platelets: 355 10*3/uL (ref 150–400)
RBC: 3.99 MIL/uL (ref 3.87–5.11)
RDW: 14.7 % (ref 11.5–15.5)
WBC: 11.2 10*3/uL — ABNORMAL HIGH (ref 4.0–10.5)

## 2013-11-11 LAB — HEMOGLOBIN, FINGERSTICK: HEMOGLOBIN, FINGERSTICK: 11.3 g/dL — AB (ref 12.0–16.0)

## 2013-11-11 NOTE — Telephone Encounter (Signed)
Spoke with patient. Patient states that she started her cycle 7/25 and has not stopped bleeding. Cycle was "normal" for the first week but has gotten heavier since. Patient states her cycle usually lasts 4-6 days. Woke up three times in the night having to change tampon. "It has been super heavy since Friday or Saturday and seems to be getting heavier." Patient is having abdominal cramping and has taken 4 ibuprofen today. Patient is not currently on birth control. Only new change in medication is taking Vyvanse 3 months ago. Advised patient that she will need to come in to be seen for evaluation. Patient is agreeable and states that she is in town today. Appointment scheduled for today at 11:15am with Emma Rojas CNM. Patient agreeable to date and time.  Routing to Emma Rojas CNM  Cc: Emma FranklinPatricia Rolen-Grubb, FNP   Routing to provider for final review. Patient agreeable to disposition. Will close encounter

## 2013-11-11 NOTE — Progress Notes (Signed)
33 y.o. Single Caucasian female G0P0 with LMP 10/31/13 here complaining of bleeding continued for two weeks after onset with period.  Contraception is none needed due to female relationship.patient still bleeding today and used 3 tampons from 11 pm to 6 am. Has not soaked a pad an hour since onset only used tampons. Patient took Motrin yesterday and noted the bleeding decreased some, but did not repeat. She has never had this to occur, normal periods with only moderate to heavy flow with 4-5 day duration. Denies dizziness or faintness."Feels fine".   Weight:  275 POCT: Hgb- 11.3  O: Healthy WN WD female   Alert oriented x3 Skin:warm and dry Thyroid:normal to inspection and palpation and non-palpable Pelvic exam: external genital exam normal, no lesions BUS negative Vagina: small amount of blood noted in vagina Cervix: Non tender, normal, small amount of dark blood noted from cervix Uterus: mid position, non tender, soft Adnexa: non tender, no masses(large palpated), due to body habitus  A:Normal Pelvic exam 2-DUB 3-Morbid Obesity    P:Reviewed findings of normal pelvic exam and bleeding noted. Discussed weight and thyroid which can interfere with normal cycle. Discussed Motrin use to help with decreasing bleeding. Instructed to start 800 mg every 8 hours for the next 48 hours with food and advise if bleeding increases or has decreased. Warning signs of excessive bleeding given. Start on OTC counter iron and increase iron in diet. Consulted with Dr. Farrel GobbleLathrop who recommends above also and PUS with endo bx order in. Discussed recommendation with patient and agreeable. Patient informed insurance will be called to her and then will be scheduled. Voiced understanding. Lab: TSH,CBC  Rv prn as above

## 2013-11-11 NOTE — Telephone Encounter (Signed)
Patient calling to speak with the nurse about "having a cycle for the last two weeks." She said this is not normal for her and wants to speak with the nurse to see if she needs an appointment.

## 2013-11-11 NOTE — Patient Instructions (Signed)

## 2013-11-12 LAB — TSH: TSH: 5.458 u[IU]/mL — ABNORMAL HIGH (ref 0.350–4.500)

## 2013-11-13 NOTE — Progress Notes (Signed)
Note reviewed, agree with plan.  Nelsie Domino, MD  

## 2013-11-16 ENCOUNTER — Telehealth: Payer: Self-pay | Admitting: Gynecology

## 2013-11-16 DIAGNOSIS — N938 Other specified abnormal uterine and vaginal bleeding: Secondary | ICD-10-CM

## 2013-11-16 NOTE — Telephone Encounter (Signed)
Patient returned call.  She is agreeable to pelvic ultrasound tomorrow with Dr. Farrel GobbleLathrop, possible EMB.  Scheduled for 1400 with consult at 1430.  Routing to provider for final review. Patient agreeable to disposition. Will close encounter   cc Cathrine MusterSabrina Franklin

## 2013-11-16 NOTE — Telephone Encounter (Signed)
Attempted call to patient.  Unable to leave voice mail.  Mychart message sent to call our office at her earliest Convenience .

## 2013-11-16 NOTE — Telephone Encounter (Signed)
Called patient to schedule PUS/EMB because per note, Farrel GobbleLathrop wants her to come in tomorrow...call went to voicemail...mailbox is full Pr $20

## 2013-11-17 ENCOUNTER — Ambulatory Visit (INDEPENDENT_AMBULATORY_CARE_PROVIDER_SITE_OTHER): Payer: BC Managed Care – PPO

## 2013-11-17 ENCOUNTER — Ambulatory Visit (INDEPENDENT_AMBULATORY_CARE_PROVIDER_SITE_OTHER): Payer: BC Managed Care – PPO | Admitting: Gynecology

## 2013-11-17 VITALS — BP 114/72 | Resp 18 | Ht 63.0 in | Wt 278.0 lb

## 2013-11-17 DIAGNOSIS — N938 Other specified abnormal uterine and vaginal bleeding: Secondary | ICD-10-CM

## 2013-11-17 DIAGNOSIS — D649 Anemia, unspecified: Secondary | ICD-10-CM

## 2013-11-17 DIAGNOSIS — N949 Unspecified condition associated with female genital organs and menstrual cycle: Secondary | ICD-10-CM

## 2013-11-17 DIAGNOSIS — N925 Other specified irregular menstruation: Secondary | ICD-10-CM

## 2013-11-17 DIAGNOSIS — E039 Hypothyroidism, unspecified: Secondary | ICD-10-CM

## 2013-11-17 MED ORDER — LEVOTHYROXINE SODIUM 50 MCG PO TABS: 50.0000 ug | ORAL_TABLET | Freq: Every day | ORAL | Status: DC

## 2013-11-17 NOTE — Progress Notes (Signed)
       Pt here for PUS due to single episode of DUB and limited exam due to obesity.   Her images were reviewed with her, her uterus in normal size and shape.  Her EMS is symmetrical, trilayered. ovaries with follicles.  In addition, reviewed her recent labs, her TSH was noted to be elevated 5.5 and her Hb a little low 11.3.  Discussed impact of TSH on FSH and bleeding.  Pt does report fatigue, difficulty concentrating as well.    Reviewed her past menstrual history, one time episdoe of irregular bleeding, will defer EMB at this time but did inform pt if her bleeding does not improve after thyroid is corrected, would consider bx and she is agreeable. Synthroid 50mcg sent in, instructed on use Repeat TSH in 6w Will repeat CBC at that time as well. Questions addressed  6341m spent counseling, >50% face to face

## 2013-11-23 ENCOUNTER — Telehealth: Payer: Self-pay

## 2013-11-23 MED ORDER — LISDEXAMFETAMINE DIMESYLATE 50 MG PO CAPS
50.0000 mg | ORAL_CAPSULE | Freq: Every day | ORAL | Status: DC
Start: 2013-11-23 — End: 2013-12-28

## 2013-11-23 NOTE — Telephone Encounter (Signed)
PT IN NEED OF HER YXVANSE, DIDN'T KNOW SHE HAD RUN OUT AND WILL BE TRYING TO MAKE AN APPT TO SEE DR Sherin QuarryOLITTLE SOON PLEASE CALL [phone]

## 2013-11-24 NOTE — Telephone Encounter (Signed)
i printed 8/17 did it get signed???

## 2013-11-26 NOTE — Telephone Encounter (Signed)
Pt picked up on 8/18

## 2013-12-24 ENCOUNTER — Telehealth: Payer: Self-pay

## 2013-12-24 NOTE — Telephone Encounter (Signed)
Pt of Dr. Merla Riches needs refill on lisdexamfetamine (VYVANSE) 50 MG capsule [[id number]], advised to allow 24-72 hours for pick up call

## 2013-12-28 MED ORDER — LISDEXAMFETAMINE DIMESYLATE 50 MG PO CAPS: 50.0000 mg | ORAL_CAPSULE | Freq: Every day | ORAL | Status: DC

## 2013-12-28 NOTE — Telephone Encounter (Signed)
Meds ordered this encounter  Medications   lisdexamfetamine (VYVANSE) 50 MG capsule    Sig: Take 1 capsule (50 mg total) by mouth daily.    Dispense:  30 capsule    Refill:  0    

## 2013-12-28 NOTE — Telephone Encounter (Signed)
LMOM that rx is ready for p/u 

## 2014-01-04 ENCOUNTER — Telehealth: Payer: Self-pay

## 2014-01-04 DIAGNOSIS — E039 Hypothyroidism, unspecified: Secondary | ICD-10-CM

## 2014-01-04 MED ORDER — LEVOTHYROXINE SODIUM 50 MCG PO TABS: 50.0000 ug | ORAL_TABLET | Freq: Every day | ORAL | Status: DC

## 2014-01-04 NOTE — Telephone Encounter (Signed)
Last AEX: 03/02/13 Last refill:11/17/13 #50 X 0 Current AEX:03/09/14  Calling pt to make lab appt for TSH recheck per Dr. Farrel Gobble

## 2014-01-04 NOTE — Telephone Encounter (Signed)
S/W pt. Pt has been out of town the month of September. She stated she will be back on Friday but she only has 2 pills left of her synthroid. Made Lab appt for Jan 08, 2014 @ 11:00. Also sent a 2 week supply until her results come in. Pt voiced understanding and agreed.   Routed to provider for final review

## 2014-01-08 ENCOUNTER — Other Ambulatory Visit (INDEPENDENT_AMBULATORY_CARE_PROVIDER_SITE_OTHER): Payer: BC Managed Care – PPO

## 2014-01-08 DIAGNOSIS — D649 Anemia, unspecified: Secondary | ICD-10-CM

## 2014-01-08 DIAGNOSIS — E039 Hypothyroidism, unspecified: Secondary | ICD-10-CM

## 2014-01-08 LAB — CBC
HCT: 37.7 % (ref 36.0–46.0)
Hemoglobin: 12.6 g/dL (ref 12.0–15.0)
MCH: 30.3 pg (ref 26.0–34.0)
MCHC: 33.4 g/dL (ref 30.0–36.0)
MCV: 90.6 fL (ref 78.0–100.0)
PLATELETS: 374 10*3/uL (ref 150–400)
RBC: 4.16 MIL/uL (ref 3.87–5.11)
RDW: 14.6 % (ref 11.5–15.5)
WBC: 10 10*3/uL (ref 4.0–10.5)

## 2014-01-09 LAB — TSH: TSH: 2.54 u[IU]/mL (ref 0.350–4.500)

## 2014-01-11 ENCOUNTER — Telehealth: Payer: Self-pay

## 2014-01-11 DIAGNOSIS — E038 Other specified hypothyroidism: Secondary | ICD-10-CM

## 2014-01-11 NOTE — Telephone Encounter (Signed)
Left message to call Kaitlyn at [phone]. 

## 2014-01-11 NOTE — Telephone Encounter (Signed)
Message copied by Jannet AskewHINES, Yeshaya Vath E on Mon Jan 11, 2014  9:35 AM ------      Message from: Douglass RiversLATHROP, TRACY      Created: Mon Jan 11, 2014  8:24 AM       TSH is better, pls ask about her bleeding, CBC looks great, WBC is normal ------

## 2014-01-11 NOTE — Telephone Encounter (Signed)
Spoke with patient. Advised of results as seen below from Dr.Lathrop. Patient is agreeable and would like refills for synthroid called into [facility] on file. Patient would also like to know when she needs to come back in for follow up. States that her bleeding is "back to normal." Advised would check with Dr.Lathrop about follow up and getting rx sent in and give patient a call back. Patient is agreeable.

## 2014-01-12 MED ORDER — LEVOTHYROXINE SODIUM 50 MCG PO TABS: 50.0000 ug | ORAL_TABLET | Freq: Every day | ORAL | Status: DC

## 2014-01-12 NOTE — Telephone Encounter (Signed)
GREAT! RX SENT

## 2014-01-28 ENCOUNTER — Telehealth: Payer: Self-pay

## 2014-01-28 NOTE — Telephone Encounter (Signed)
Pt of Dr. Merla Richesoolittle t is requesting refill on vyvance

## 2014-01-29 MED ORDER — LISDEXAMFETAMINE DIMESYLATE 50 MG PO CAPS: 50.0000 mg | ORAL_CAPSULE | Freq: Every day | ORAL | Status: DC

## 2014-01-31 NOTE — Telephone Encounter (Signed)
Left message on machine that rx is ready for pickup and that she needs f/u next month

## 2014-02-06 ENCOUNTER — Other Ambulatory Visit: Payer: Self-pay | Admitting: Internal Medicine

## 2014-02-07 NOTE — Telephone Encounter (Signed)
Called in.

## 2014-03-09 ENCOUNTER — Ambulatory Visit: Payer: BC Managed Care – PPO | Admitting: Nurse Practitioner

## 2014-03-09 ENCOUNTER — Encounter: Payer: Self-pay | Admitting: Nurse Practitioner

## 2014-03-10 ENCOUNTER — Telehealth: Payer: Self-pay

## 2014-03-10 NOTE — Telephone Encounter (Signed)
Pt states she would like to make an appt with DR. DOOLITTLE sometime but would like to have her VYVANSE 50 MGS. Please call [phone]

## 2014-03-12 MED ORDER — LISDEXAMFETAMINE DIMESYLATE 50 MG PO CAPS: 50.0000 mg | ORAL_CAPSULE | Freq: Every day | ORAL | Status: DC

## 2014-03-12 NOTE — Telephone Encounter (Signed)
Spoke to pt; informed her rx is ready for pick up.

## 2014-03-17 ENCOUNTER — Other Ambulatory Visit: Payer: Self-pay | Admitting: Internal Medicine

## 2014-04-13 ENCOUNTER — Telehealth: Payer: Self-pay

## 2014-04-13 NOTE — Telephone Encounter (Signed)
Pt has not been seen since 07/2013. Advised she would need to come in for an office visit for refills. Pt was given Dr. Netta Corriganoolittle's schedule at the walk in clinic. Pt is going to come in Monday.

## 2014-04-13 NOTE — Telephone Encounter (Signed)
PATIENT WOULD LIKE DR. DOOLITTLE TO KNOW THAT SHE TOOK HER LAST VYVANSE 50 MG THIS MORNING. SHE SAID SHE HAS NOT BEEN ABLE TO GET AN APPOINTMENT WITH HIM. SHE NEEDS TO GET A PRESCRIPTION WRITTEN. PLEASE CALL HER WHEN SHE CAN PICK IT UP. BEST PHONE [phone] (CELL)  MBC

## 2014-04-19 ENCOUNTER — Other Ambulatory Visit: Payer: Self-pay | Admitting: Internal Medicine

## 2014-04-20 ENCOUNTER — Ambulatory Visit (INDEPENDENT_AMBULATORY_CARE_PROVIDER_SITE_OTHER): Payer: BLUE CROSS/BLUE SHIELD | Admitting: Internal Medicine

## 2014-04-20 VITALS — BP 120/80 | HR 74 | Temp 98.5°F | Resp 18 | Ht 63.5 in | Wt 265.0 lb

## 2014-04-20 DIAGNOSIS — F900 Attention-deficit hyperactivity disorder, predominantly inattentive type: Secondary | ICD-10-CM

## 2014-04-20 DIAGNOSIS — E039 Hypothyroidism, unspecified: Secondary | ICD-10-CM

## 2014-04-20 DIAGNOSIS — Z6841 Body Mass Index (BMI) 40.0 and over, adult: Secondary | ICD-10-CM

## 2014-04-20 MED ORDER — LISDEXAMFETAMINE DIMESYLATE 60 MG PO CAPS: 60.0000 mg | ORAL_CAPSULE | Freq: Every day | ORAL | Status: DC

## 2014-04-20 MED ORDER — LISDEXAMFETAMINE DIMESYLATE 60 MG PO CAPS: 60.0000 mg | ORAL_CAPSULE | ORAL | Status: DC

## 2014-04-20 MED ORDER — BISOPROLOL-HYDROCHLOROTHIAZIDE 5-6.25 MG PO TABS: 1.0000 | ORAL_TABLET | Freq: Every day | ORAL | Status: DC

## 2014-04-20 NOTE — Progress Notes (Signed)
Subjective:  This chart was scribed for Emma Siaobert Samanta Gal, MD by Emma Rojas, Medical Scribe. This patient was seen in Room 4 and the patient's care was started at 4:11 PM.   Patient ID: Emma Rojas, female    DOB: 1981/03/07, 34 y.o.   MRN: [id number]  Chief Complaint  Patient presents with  . rx refills    ziac, levo, vyvanse    HPI HPI Comments: Emma Rojas is a 34 y.o. female who presents to the [facility] for a refill of her Zaic, Levothroid, and Vyvanse medication. Pt states she has been doing well on her medications with no complaints. Pt had normal blood work this past fall after getting put on a thyroid medication. Pt has been exercising with a personal trainer and has lost about 15 pounds since this began. Pt thinks that the exercise and weight loss has been helping to manage her BP along with the medication. Pt would like to work on getting her BP down to where she no longer needs to use the medication. She denies any mood swings or emotional symptoms from the medications.   Current vyv 50 wears off 4pm---no side eff  Past Medical History  Diagnosis Date  . Hypertension   . ADD (attention deficit disorder with hyperactivity)   . Allergy   . Asthma   . Anemia   . Thyroid disease    Allergies  Allergen Reactions  . Amoxicillin Hives   Current Outpatient Prescriptions on File Prior to Visit  Medication Sig Dispense Refill  . bisoprolol-hydrochlorothiazide (ZIAC) 5-6.25 MG per tablet Take 1 tablet by mouth daily. PATIENT NEEDS OFFICE VISIT FOR ADDITIONAL REFILLS 30 tablet 0  . levothyroxine (SYNTHROID, LEVOTHROID) 50 MCG tablet Take 1 tablet (50 mcg total) by mouth daily before breakfast. 90 tablet 3  . lisdexamfetamine (VYVANSE) 50 MG capsule Take 1 capsule (50 mg total) by mouth daily. 30 capsule 0   No current facility-administered medications on file prior to visit.     Review of Systems  All systems are negative except as noted in the  HPI and PMH.  No ha or vis chg No palpit or doe No tremor No irritab No mood decr     Objective:   Physical Exam  Constitutional: She is oriented to person, place, and time. She appears well-developed and well-nourished. No distress.  HENT:  Head: Normocephalic and atraumatic.  Eyes: Conjunctivae and EOM are normal.  Neck: Neck supple.  Cardiovascular: Normal rate.   Pulmonary/Chest: Effort normal. No respiratory distress.  Musculoskeletal: Normal range of motion.  Neurological: She is alert and oriented to person, place, and time.  Skin: Skin is warm and dry.  Psychiatric: She has a normal mood and affect. Her behavior is normal.  Nursing note and vitals reviewed.  Filed Vitals:   04/20/14 1555  BP: 120/80  Pulse: 74  Temp: 98.5 F (36.9 C)  Resp: 18   Wt Readings from Last 3 Encounters:  04/20/14 265 lb (120.203 kg)  11/17/13 278 lb (126.1 kg)  11/11/13 275 lb (124.739 kg)  4/15  290 lb BP 120/80 mmHg  Pulse 74  Temp(Src) 98.5 F (36.9 C) (Oral)  Resp 18  Ht 5' 3.5" (1.613 m)  Wt 265 lb (120.203 kg)  BMI 46.20 kg/m2  SpO2 95%  LMP 04/06/2014      Assessment & Plan:  Attention deficit hyperactivity disorder (ADHD), predominantly inattentive type  incr to 60 vyv BMI 45.0-49.9, adult  contin  Hypothyroidism, unspecified hypothyroidism type  Gyn started rx Meds ordered this encounter  Medications  . lisdexamfetamine (VYVANSE) 60 MG capsule    Sig: Take 1 capsule (60 mg total) by mouth daily.    Dispense:  30 capsule    Refill:  0  . lisdexamfetamine (VYVANSE) 60 MG capsule    Sig: Take 1 capsule (60 mg total) by mouth every morning. For 30d after signed    Dispense:  30 capsule    Refill:  0  . lisdexamfetamine (VYVANSE) 60 MG capsule    Sig: Take 1 capsule (60 mg total) by mouth every morning. For 60d after signed    Dispense:  30 capsule    Refill:  0   ziac refilled as well F/u 3-50mo  I have completed the patient encounter in its entirety as  documented by the scribe, with editing by me where necessary. Emma Rojas, M.D.

## 2014-06-30 ENCOUNTER — Encounter: Payer: Self-pay | Admitting: Nurse Practitioner

## 2014-07-26 ENCOUNTER — Encounter: Payer: Self-pay | Admitting: Internal Medicine

## 2014-07-28 ENCOUNTER — Telehealth: Payer: Self-pay

## 2014-07-28 MED ORDER — LISDEXAMFETAMINE DIMESYLATE 60 MG PO CAPS: 60.0000 mg | ORAL_CAPSULE | ORAL | Status: DC

## 2014-07-28 MED ORDER — LISDEXAMFETAMINE DIMESYLATE 60 MG PO CAPS: 60.0000 mg | ORAL_CAPSULE | Freq: Every day | ORAL | Status: DC

## 2014-07-28 NOTE — Telephone Encounter (Signed)
Pt in need of her VYVANSE 60 MG. States she [facility] but hasn't [facility]. Please call pt at [phone]

## 2014-07-28 NOTE — Telephone Encounter (Signed)
Rx ready at 104

## 2014-10-21 ENCOUNTER — Encounter: Payer: Self-pay | Admitting: Nurse Practitioner

## 2014-11-01 ENCOUNTER — Encounter: Payer: Self-pay | Admitting: Internal Medicine

## 2014-11-02 MED ORDER — LISDEXAMFETAMINE DIMESYLATE 60 MG PO CAPS: 60.0000 mg | ORAL_CAPSULE | Freq: Every day | ORAL | Status: DC

## 2014-11-02 MED ORDER — LISDEXAMFETAMINE DIMESYLATE 60 MG PO CAPS: 60.0000 mg | ORAL_CAPSULE | ORAL | Status: DC

## 2014-11-02 NOTE — Telephone Encounter (Signed)
Meds ordered this encounter  Medications  . lisdexamfetamine (VYVANSE) 60 MG capsule    Sig: Take 1 capsule (60 mg total) by mouth every morning. For 60d after signed    Dispense:  30 capsule    Refill:  0  . lisdexamfetamine (VYVANSE) 60 MG capsule    Sig: Take 1 capsule (60 mg total) by mouth every morning. For 30d after signed    Dispense:  30 capsule    Refill:  0  . lisdexamfetamine (VYVANSE) 60 MG capsule    Sig: Take 1 capsule (60 mg total) by mouth daily.    Dispense:  30 capsule    Refill:  0

## 2015-01-31 ENCOUNTER — Telehealth: Payer: Self-pay | Admitting: Internal Medicine

## 2015-01-31 ENCOUNTER — Encounter: Payer: Self-pay | Admitting: Internal Medicine

## 2015-01-31 DIAGNOSIS — E038 Other specified hypothyroidism: Secondary | ICD-10-CM

## 2015-01-31 MED ORDER — LISDEXAMFETAMINE DIMESYLATE 60 MG PO CAPS: 60.0000 mg | ORAL_CAPSULE | Freq: Every day | ORAL | Status: DC

## 2015-01-31 MED ORDER — LISDEXAMFETAMINE DIMESYLATE 60 MG PO CAPS: 60.0000 mg | ORAL_CAPSULE | ORAL | Status: DC

## 2015-01-31 MED ORDER — LEVOTHYROXINE SODIUM 50 MCG PO TABS: 50.0000 ug | ORAL_TABLET | Freq: Every day | ORAL | Status: DC

## 2015-01-31 NOTE — Telephone Encounter (Signed)
Meds ordered this encounter  Medications  . levothyroxine (SYNTHROID, LEVOTHROID) 50 MCG tablet    Sig: Take 1 tablet (50 mcg total) by mouth daily before breakfast.    Dispense:  90 tablet    Refill:  1

## 2015-01-31 NOTE — Addendum Note (Signed)
Addended by: Tonye PearsonOLITTLE, Ayuub Penley P on: 01/31/2015 09:45 PM   Modules accepted: Orders

## 2015-02-01 NOTE — Telephone Encounter (Signed)
Rx in drawer. Pt notified Mychart.

## 2015-03-07 ENCOUNTER — Encounter: Payer: Self-pay | Admitting: Internal Medicine

## 2015-03-31 ENCOUNTER — Ambulatory Visit (INDEPENDENT_AMBULATORY_CARE_PROVIDER_SITE_OTHER): Payer: BLUE CROSS/BLUE SHIELD | Admitting: Emergency Medicine

## 2015-03-31 VITALS — BP 128/80 | HR 91 | Temp 98.5°F | Resp 17 | Ht 63.5 in | Wt 262.0 lb

## 2015-03-31 DIAGNOSIS — J209 Acute bronchitis, unspecified: Secondary | ICD-10-CM

## 2015-03-31 DIAGNOSIS — J0140 Acute pansinusitis, unspecified: Secondary | ICD-10-CM

## 2015-03-31 MED ORDER — PSEUDOEPHEDRINE-GUAIFENESIN ER 60-600 MG PO TB12: 1.0000 | ORAL_TABLET | Freq: Two times a day (BID) | ORAL | Status: DC

## 2015-03-31 MED ORDER — LEVOFLOXACIN 500 MG PO TABS: 500.0000 mg | ORAL_TABLET | Freq: Every day | ORAL | Status: AC

## 2015-03-31 MED ORDER — HYDROCOD POLST-CPM POLST ER 10-8 MG/5ML PO SUER: 5.0000 mL | Freq: Two times a day (BID) | ORAL | Status: DC

## 2015-03-31 NOTE — Patient Instructions (Signed)

## 2015-03-31 NOTE — Progress Notes (Signed)
Subjective:  Patient ID: Emma Rojas, female    DOB: 05/21/1980  Age: 34 y.o. MRN: [id number]  CC: Sinusitis and URI   HPI Emma Rojas presents  with several day history of fever and chills. Congestion and nasal discharge is purulent. She has postnasal drainage and pressure and pain around her cheeks and forehead. She has pressure in ears. She has a cough productive of purulent sputum. She has no wheezing or shortness of breath. Her fever and chills are resolved. She has no nausea vomiting or stool change. No rash. She's had no improvement with over-the-counter medication  History Emma Rojas has a past medical history of Hypertension; ADD (attention deficit disorder with hyperactivity); Allergy; Asthma; Anemia; and Thyroid disease.   She has past surgical history that includes Tonsillectomy and adenoidectomy.   Her   family history includes Prostate cancer in her father.  She   reports that she quit smoking about 7 years ago. She does not have any smokeless tobacco history on file. She reports that she drinks about 2.5 oz of alcohol per week. She reports that she does not use illicit drugs.  Outpatient Prescriptions Prior to Visit  Medication Sig Dispense Refill  . bisoprolol-hydrochlorothiazide (ZIAC) 5-6.25 MG per tablet Take 1 tablet by mouth daily. 90 tablet 3  . levothyroxine (SYNTHROID, LEVOTHROID) 50 MCG tablet Take 1 tablet (50 mcg total) by mouth daily before breakfast. 90 tablet 1  . lisdexamfetamine (VYVANSE) 60 MG capsule Take 1 capsule (60 mg total) by mouth every morning. For 60d after signed 30 capsule 0  . lisdexamfetamine (VYVANSE) 60 MG capsule Take 1 capsule (60 mg total) by mouth every morning. For 30d after signed 30 capsule 0  . lisdexamfetamine (VYVANSE) 60 MG capsule Take 1 capsule (60 mg total) by mouth daily. 30 capsule 0   No facility-administered medications prior to visit.    Social History   Social History  . Marital Status: Single    Spouse Name:  N/A  . Number of Children: 0  . Years of Education: N/A   Occupational History  .     Social History Main Topics  . Smoking status: Former Smoker    Quit date: 03/02/2008  . Smokeless tobacco: None  . Alcohol Use: 2.5 oz/week    5 drink(s) per week  . Drug Use: No  . Sexual Activity: No   Other Topics Concern  . None   Social History Narrative     Review of Systems  Constitutional: Positive for fever and chills. Negative for appetite change.  HENT: Positive for congestion, ear pain, postnasal drip, rhinorrhea and sinus pressure. Negative for sore throat.   Eyes: Negative for pain and redness.  Respiratory: Positive for cough. Negative for shortness of breath and wheezing.   Cardiovascular: Negative for leg swelling.  Gastrointestinal: Negative for nausea, vomiting, abdominal pain, diarrhea, constipation and blood in stool.  Endocrine: Negative for polyuria.  Genitourinary: Negative for dysuria, urgency, frequency and flank pain.  Musculoskeletal: Negative for gait problem.  Skin: Negative for rash.  Neurological: Negative for weakness and headaches.  Psychiatric/Behavioral: Negative for confusion and decreased concentration. The patient is not nervous/anxious.     Objective:  BP 128/80 mmHg  Pulse 91  Temp(Src) 98.5 F (36.9 C) (Oral)  Resp 17  Ht 5' 3.5" (1.613 m)  Wt 262 lb (118.842 kg)  BMI 45.68 kg/m2  SpO2 99%  LMP 03/17/2015  Physical Exam  Constitutional: She is oriented to person, place, and time. She  appears well-developed and well-nourished. No distress.  HENT:  Head: Normocephalic and atraumatic.  Right Ear: External ear normal.  Left Ear: External ear normal.  Nose: Nose normal.  Eyes: Conjunctivae and EOM are normal. Pupils are equal, round, and reactive to light. No scleral icterus.  Neck: Normal range of motion. Neck supple. No tracheal deviation present.  Cardiovascular: Normal rate, regular rhythm and normal heart sounds.   Pulmonary/Chest:  Effort normal. No respiratory distress. She has no wheezes. She has no rales.  Abdominal: She exhibits no mass. There is no tenderness. There is no rebound and no guarding.  Musculoskeletal: She exhibits no edema.  Lymphadenopathy:    She has no cervical adenopathy.  Neurological: She is alert and oriented to person, place, and time. Coordination normal.  Skin: Skin is warm and dry. No rash noted.  Psychiatric: She has a normal mood and affect. Her behavior is normal.      Assessment & Plan:   Emma Rojas was seen today for sinusitis and uri.  Diagnoses and all orders for this visit:  Acute bronchitis, unspecified organism  Acute pansinusitis, recurrence not specified  Other orders -     levofloxacin (LEVAQUIN) 500 MG tablet; Take 1 tablet (500 mg total) by mouth daily. -     pseudoephedrine-guaifenesin (MUCINEX D) 60-600 MG 12 hr tablet; Take 1 tablet by mouth every 12 (twelve) hours. -     chlorpheniramine-HYDROcodone (TUSSIONEX PENNKINETIC ER) 10-8 MG/5ML SUER; Take 5 mLs by mouth 2 (two) times daily.  I am having Emma Rojas start on levofloxacin, pseudoephedrine-guaifenesin, and chlorpheniramine-HYDROcodone. I am also having her maintain her bisoprolol-hydrochlorothiazide, levothyroxine, lisdexamfetamine, lisdexamfetamine, and lisdexamfetamine.  Meds ordered this encounter  Medications  . levofloxacin (LEVAQUIN) 500 MG tablet    Sig: Take 1 tablet (500 mg total) by mouth daily.    Dispense:  10 tablet    Refill:  0  . pseudoephedrine-guaifenesin (MUCINEX D) 60-600 MG 12 hr tablet    Sig: Take 1 tablet by mouth every 12 (twelve) hours.    Dispense:  18 tablet    Refill:  0  . chlorpheniramine-HYDROcodone (TUSSIONEX PENNKINETIC ER) 10-8 MG/5ML SUER    Sig: Take 5 mLs by mouth 2 (two) times daily.    Dispense:  60 mL    Refill:  0    Appropriate red flag conditions were discussed with the patient as well as actions that should be taken.  Patient expressed his  understanding.  Follow-up: Return if symptoms worsen or fail to improve.  Carmelina DaneAnderson, Tayvin Preslar S, MD

## 2015-04-26 ENCOUNTER — Telehealth: Payer: Self-pay

## 2015-04-26 MED ORDER — BISOPROLOL-HYDROCHLOROTHIAZIDE 5-6.25 MG PO TABS: 1.0000 | ORAL_TABLET | Freq: Every day | ORAL | Status: DC

## 2015-04-26 NOTE — Telephone Encounter (Signed)
Patient needs her bisoprolol-hydrochlorothiazide [facility]) 5-6.25 MG per tablet refilled, stated she has had her pharmacy call us a few times and they told her they have not heard anything from [place], I did not see anything in the system but I told her i would make this high priority since she was completely our of her medication.  She uses the [facility] on [address].

## 2015-04-26 NOTE — Telephone Encounter (Signed)
Rx sent 

## 2015-05-23 ENCOUNTER — Encounter: Payer: Self-pay | Admitting: Family Medicine

## 2015-05-23 ENCOUNTER — Ambulatory Visit (INDEPENDENT_AMBULATORY_CARE_PROVIDER_SITE_OTHER): Payer: BLUE CROSS/BLUE SHIELD | Admitting: Family Medicine

## 2015-05-23 VITALS — BP 108/58 | HR 74 | Temp 98.0°F | Resp 14 | Ht 64.0 in | Wt 266.2 lb

## 2015-05-23 DIAGNOSIS — Z13220 Encounter for screening for lipoid disorders: Secondary | ICD-10-CM | POA: Diagnosis not present

## 2015-05-23 DIAGNOSIS — E038 Other specified hypothyroidism: Secondary | ICD-10-CM

## 2015-05-23 DIAGNOSIS — Z23 Encounter for immunization: Secondary | ICD-10-CM

## 2015-05-23 DIAGNOSIS — Z131 Encounter for screening for diabetes mellitus: Secondary | ICD-10-CM

## 2015-05-23 DIAGNOSIS — F988 Other specified behavioral and emotional disorders with onset usually occurring in childhood and adolescence: Secondary | ICD-10-CM

## 2015-05-23 DIAGNOSIS — I10 Essential (primary) hypertension: Secondary | ICD-10-CM

## 2015-05-23 DIAGNOSIS — F909 Attention-deficit hyperactivity disorder, unspecified type: Secondary | ICD-10-CM

## 2015-05-23 LAB — HEMOGLOBIN A1C
HEMOGLOBIN A1C: 5.5 % (ref <5.7)
MEAN PLASMA GLUCOSE: 111 mg/dL (ref <117)

## 2015-05-23 LAB — COMPREHENSIVE METABOLIC PANEL
ALT: 27 U/L (ref 6–29)
AST: 24 U/L (ref 10–30)
Albumin: 3.8 g/dL (ref 3.6–5.1)
Alkaline Phosphatase: 64 U/L (ref 33–115)
BUN: 11 mg/dL (ref 7–25)
CHLORIDE: 103 mmol/L (ref 98–110)
CO2: 24 mmol/L (ref 20–31)
CREATININE: 0.7 mg/dL (ref 0.50–1.10)
Calcium: 8.9 mg/dL (ref 8.6–10.2)
GLUCOSE: 90 mg/dL (ref 65–99)
Potassium: 4.5 mmol/L (ref 3.5–5.3)
SODIUM: 136 mmol/L (ref 135–146)
Total Bilirubin: 0.4 mg/dL (ref 0.2–1.2)
Total Protein: 7 g/dL (ref 6.1–8.1)

## 2015-05-23 LAB — LIPID PANEL
Cholesterol: 191 mg/dL (ref 125–200)
HDL: 62 mg/dL (ref >=46)
LDL CALC: 100 mg/dL (ref <130)
TRIGLYCERIDES: 145 mg/dL (ref <150)
Total CHOL/HDL Ratio: 3.1 Ratio (ref <=5.0)
VLDL: 29 mg/dL (ref <30)

## 2015-05-23 LAB — TSH: TSH: 3.2 m[IU]/L

## 2015-05-23 MED ORDER — LISDEXAMFETAMINE DIMESYLATE 60 MG PO CAPS: 60.0000 mg | ORAL_CAPSULE | ORAL | Status: DC

## 2015-05-23 MED ORDER — LISDEXAMFETAMINE DIMESYLATE 60 MG PO CAPS: 60.0000 mg | ORAL_CAPSULE | Freq: Every day | ORAL | Status: DC

## 2015-05-23 MED ORDER — LEVOTHYROXINE SODIUM 50 MCG PO TABS: 50.0000 ug | ORAL_TABLET | Freq: Every day | ORAL | Status: DC

## 2015-05-23 MED ORDER — BISOPROLOL-HYDROCHLOROTHIAZIDE 5-6.25 MG PO TABS: 1.0000 | ORAL_TABLET | Freq: Every day | ORAL | Status: DC

## 2015-05-23 NOTE — Patient Instructions (Signed)
It was good to see you today! Your BP is on the low side- change to a 1/2 tablet of your BP pill. Then check your pressures at home 2-3x a week.  If you continue to run less than 125/90 go ahead and stop the medication.   You can discuss this further with Dr. Merla Riches at your follow-up visit I will be in touch with your labs asap- you may want to wait before you fill your thyroid medication in case we need to adjust your dose

## 2015-05-23 NOTE — Progress Notes (Signed)
[facility] [address] [place] [address] [phone]  Date:  05/23/2015   Name:  Emma Rojas   DOB:  February 18, 1981   MRN:  [id number]  PCP:  Tonye Pearson, MD    Chief Complaint: Medication Refill   History of Present Illness:  Emma Rojas is a 35 y.o. very pleasant female patient who presents with the following:  History of HTN, ADD, hypothyroidism.  Here today seeking medication refills NCCSR shows that she last received vyvanse 60, #30 on 04/15/15- this was rx on 01/31/15  She is fasting today- we can check labs for her today also   LMP 3.5 weeks ago She has noted that her BP is a bit low- ?can she reduce her medication  Lab Results  Component Value Date   TSH 2.540 01/08/2014   BP Readings from Last 3 Encounters:  05/23/15 108/58  03/31/15 128/80  04/20/14 120/80   Wt Readings from Last 3 Encounters:  05/23/15 266 lb 3.2 oz (120.748 kg)  03/31/15 262 lb (118.842 kg)  04/20/14 265 lb (120.203 kg)     Patient Active Problem List   Diagnosis Date Noted  . Thyroid activity decreased 04/20/2014  . BMI 45.0-49.9, adult (HCC) 09/19/2012  . Hypertension 05/16/2011  . Attention deficit hyperactivity disorder (ADHD) 05/16/2011    Past Medical History  Diagnosis Date  . Hypertension   . ADD (attention deficit disorder with hyperactivity)   . Allergy   . Asthma   . Anemia   . Thyroid disease     Past Surgical History  Procedure Laterality Date  . Tonsillectomy and adenoidectomy      Social History  Substance Use Topics  . Smoking status: Former Smoker    Quit date: 03/02/2008  . Smokeless tobacco: None  . Alcohol Use: 2.5 oz/week    5 drink(s) per week    Family History  Problem Relation Age of Onset  . Prostate cancer Father     Allergies  Allergen Reactions  . Amoxicillin Hives    Medication list has been reviewed and updated.  Current Outpatient Prescriptions on File Prior to Visit  Medication Sig  Dispense Refill  . bisoprolol-hydrochlorothiazide (ZIAC) 5-6.25 MG tablet Take 1 tablet by mouth daily. 90 tablet 0  . levothyroxine (SYNTHROID, LEVOTHROID) 50 MCG tablet Take 1 tablet (50 mcg total) by mouth daily before breakfast. 90 tablet 1  . lisdexamfetamine (VYVANSE) 60 MG capsule Take 1 capsule (60 mg total) by mouth every morning. For 60d after signed 30 capsule 0  . chlorpheniramine-HYDROcodone (TUSSIONEX PENNKINETIC ER) 10-8 MG/5ML SUER Take 5 mLs by mouth 2 (two) times daily. (Patient not taking: Reported on 05/23/2015) 60 mL 0  . lisdexamfetamine (VYVANSE) 60 MG capsule Take 1 capsule (60 mg total) by mouth every morning. For 30d after signed (Patient not taking: Reported on 05/23/2015) 30 capsule 0  . lisdexamfetamine (VYVANSE) 60 MG capsule Take 1 capsule (60 mg total) by mouth daily. (Patient not taking: Reported on 05/23/2015) 30 capsule 0  . pseudoephedrine-guaifenesin (MUCINEX D) 60-600 MG 12 hr tablet Take 1 tablet by mouth every 12 (twelve) hours. (Patient not taking: Reported on 05/23/2015) 18 tablet 0   No current facility-administered medications on file prior to visit.    Review of Systems:  As per HPI- otherwise negative.   Physical Examination: Filed Vitals:   05/23/15 0838  BP: 108/58  Pulse: 74  Temp: 98 F (36.7 C)  Resp: 14   Filed  Vitals:   05/23/15 0838  Height:  (1.626 m)  Weight: 266 lb 3.2 oz (120.748 kg)   Body mass index is 45.67 kg/(m^2). Ideal Body Weight: Weight in (lb) to have BMI = 25: 145.3  GEN: WDWN, NAD, Non-toxic, A & O x 3, obese, looks well HEENT: Atraumatic, Normocephalic. Neck supple. No masses, No LAD. Ears and Nose: No external deformity. CV: RRR, No M/G/R. No JVD. No thrill. No extra heart sounds. PULM: CTA B, no wheezes, crackles, rhonchi. No retractions. No resp. distress. No accessory muscle use. EXTR: No c/c/e NEURO Normal gait.  PSYCH: Normally interactive. Conversant. Not depressed or anxious appearing.  Calm  demeanor.     Assessment and Plan: Other specified hypothyroidism - Plan: levothyroxine (SYNTHROID, LEVOTHROID) 50 MCG tablet, TSH  Immunization due - Plan: Flu Vaccine QUAD 36+ mos IM  ADD (attention deficit disorder) - Plan: lisdexamfetamine (VYVANSE) 60 MG capsule, lisdexamfetamine (VYVANSE) 60 MG capsule, lisdexamfetamine (VYVANSE) 60 MG capsule  Essential hypertension - Plan: bisoprolol-hydrochlorothiazide (ZIAC) 5-6.25 MG tablet  Screening for hyperlipidemia - Plan: Lipid panel  Screening for diabetes mellitus - Plan: Comprehensive metabolic panel, Hemoglobin A1c  Flu shot and labs updated today She will halve her BP medication and continue to monitor her BP at home- if she is running low she may stop her medication She has been exercising more at "[employer]," a high intensity interval work-out; we think this may have improved her BP Refilled her vyvanse today Will plan further follow- up pending labs. I did refill her vyvanse for 3 months today- I am not her PCP, but her PCP Dr. Merla Riches will be retiring soon.  She plans to get in one more appointment with him but will then need to find a new primary doctor.   Signed Abbe Amsterdam, MD

## 2015-08-17 ENCOUNTER — Ambulatory Visit (INDEPENDENT_AMBULATORY_CARE_PROVIDER_SITE_OTHER): Payer: BLUE CROSS/BLUE SHIELD | Admitting: Internal Medicine

## 2015-08-17 VITALS — BP 124/78 | HR 68 | Temp 98.2°F | Resp 18 | Ht 64.0 in | Wt 246.0 lb

## 2015-08-17 DIAGNOSIS — Z6841 Body Mass Index (BMI) 40.0 and over, adult: Secondary | ICD-10-CM

## 2015-08-17 DIAGNOSIS — I10 Essential (primary) hypertension: Secondary | ICD-10-CM

## 2015-08-17 DIAGNOSIS — F988 Other specified behavioral and emotional disorders with onset usually occurring in childhood and adolescence: Secondary | ICD-10-CM

## 2015-08-17 DIAGNOSIS — J029 Acute pharyngitis, unspecified: Secondary | ICD-10-CM | POA: Diagnosis not present

## 2015-08-17 DIAGNOSIS — F900 Attention-deficit hyperactivity disorder, predominantly inattentive type: Secondary | ICD-10-CM

## 2015-08-17 DIAGNOSIS — F909 Attention-deficit hyperactivity disorder, unspecified type: Secondary | ICD-10-CM | POA: Diagnosis not present

## 2015-08-17 LAB — POCT RAPID STREP A (OFFICE): RAPID STREP A SCREEN: NEGATIVE

## 2015-08-17 MED ORDER — LISDEXAMFETAMINE DIMESYLATE 60 MG PO CAPS: 60.0000 mg | ORAL_CAPSULE | Freq: Every day | ORAL | Status: DC

## 2015-08-17 MED ORDER — HYDROCODONE-HOMATROPINE 5-1.5 MG/5ML PO SYRP: 5.0000 mL | ORAL_SOLUTION | Freq: Four times a day (QID) | ORAL | Status: DC | PRN

## 2015-08-17 MED ORDER — LISDEXAMFETAMINE DIMESYLATE 60 MG PO CAPS: 60.0000 mg | ORAL_CAPSULE | ORAL | Status: DC

## 2015-08-17 NOTE — Progress Notes (Signed)
Subjective:  By signing my name below, I, Emma Rojas, attest that this documentation has been prepared under the direction and in the presence of Emma Pearson, MD Electronically Signed: Charline Rojas, ED Scribe 08/17/2015 at 10:13 AM.   Patient ID: Emma Rojas, female    DOB: 1980-06-19, 35 y.o.   MRN: [id number]  Chief Complaint  Patient presents with  . Sore Throat   HPI HPI Comments: Emma Rojas is a 35 y.o. female, with a h/o HTN, who presents to the [facility] complaining of persistent sore throat onset 4 days ago. Pt reports increased pain with swallowing. She reports associated symptoms of subjective fever onset last night that has resolved, raspy voice, dry cough, ear congestion, nasal congestion, postnasal drip, sneezing, eye itching. Pshx of tonsillectomy approximately 5 years ago. She reports h/o strep throat multiple times in the past.     Rx Refill Pt states that Vyvanse is working well for her. She requests a refill at this visit.   Past Medical History  Diagnosis Date  . Hypertension   . ADD (attention deficit disorder with hyperactivity)   . Allergy   . Asthma   . Anemia   . Thyroid disease    Current Outpatient Prescriptions on File Prior to Visit  Medication Sig Dispense Refill  . bisoprolol-hydrochlorothiazide (ZIAC) 5-6.25 MG tablet Take 1 tablet by mouth daily. 90 tablet 2  . levothyroxine (SYNTHROID, LEVOTHROID) 50 MCG tablet Take 1 tablet (50 mcg total) by mouth daily before breakfast. 90 tablet 3  . lisdexamfetamine (VYVANSE) 60 MG capsule Take 1 capsule (60 mg total) by mouth every morning. For 60d after signed 30 capsule 0  . lisdexamfetamine (VYVANSE) 60 MG capsule Take 1 capsule (60 mg total) by mouth every morning. For 30d after signed 30 capsule 0  . lisdexamfetamine (VYVANSE) 60 MG capsule Take 1 capsule (60 mg total) by mouth daily. 30 capsule 0  . chlorpheniramine-HYDROcodone (TUSSIONEX PENNKINETIC ER) 10-8 MG/5ML  SUER Take 5 mLs by mouth 2 (two) times daily. (Patient not taking: Reported on 08/17/2015) 60 mL 0  . pseudoephedrine-guaifenesin (MUCINEX D) 60-600 MG 12 hr tablet Take 1 tablet by mouth every 12 (twelve) hours. (Patient not taking: Reported on 08/17/2015) 18 tablet 0   No current facility-administered medications on file prior to visit.   Allergies  Allergen Reactions  . Amoxicillin Hives    Review of Systems  Constitutional: Positive for fever (subjective).  HENT: Positive for congestion, ear pain, postnasal drip, sneezing, sore throat and voice change.   Eyes: Positive for itching.  Respiratory: Positive for cough.    BP 124/78 mmHg  Pulse 68  Temp(Src) 98.2 F (36.8 C) (Oral)  Resp 18  Ht  (1.626 m)  Wt 246 lb (111.585 kg)  BMI 42.21 kg/m2  SpO2 97%  LMP 08/03/2015    Objective:   Physical Exam  Constitutional: She is oriented to person, place, and time. She appears well-developed and well-nourished. No distress.  HENT:  Head: Normocephalic and atraumatic.  Right Ear: Tympanic membrane, external ear and ear canal normal.  Left Ear: Tympanic membrane, external ear and ear canal normal.  Nose: Nose normal.  Mouth/Throat: Mucous membranes are normal. Posterior oropharyngeal erythema present. No oropharyngeal exudate or posterior oropharyngeal edema.  Eyes: Conjunctivae and EOM are normal. Pupils are equal, round, and reactive to light.  Neck: Normal range of motion. Neck supple. No thyromegaly present.  Cardiovascular: Normal rate, regular rhythm, normal heart sounds  and intact distal pulses.   Pulmonary/Chest: Effort normal and breath sounds normal. She has no wheezes.  Musculoskeletal: Normal range of motion.  Lymphadenopathy:    She has no cervical adenopathy.  Neurological: She is alert and oriented to person, place, and time.  Skin: Skin is warm and dry.  Psychiatric: She has a normal mood and affect. Her behavior is normal.  Nursing note and vitals  reviewed. BP 124/78 mmHg  Pulse 68  Temp(Src) 98.2 F (36.8 C) (Oral)  Resp 18  Ht 5\' 4"  (1.626 m)  Wt 246 lb (111.585 kg)  BMI 42.21 kg/m2  SpO2 97%  LMP 08/03/2015    Results for orders placed or performed in visit on 08/17/15  POCT rapid strep A  Result Value Ref Range   Rapid Strep A Screen Negative Negative      Assessment & Plan:  Sore throat -?viral  Plan: Culture, Group A Strep due to freq strep  ADD (attention deficit disorder) - Plan: lisdexamfetamine (VYVANSE) 60 MG capsulex 3 months--may call 3 and f/u 6 w/ Emma Rojas  Essential hypertension--dx in doubt so she will d/c meds and take BP 4-7x a week for 1-2 months then f/u with Emma Rojas  BMI 45.0-49.9, adult (HCC) now down to 2242!!!! Down from 6952 in 2015  Meds ordered this encounter  Medications  . lisdexamfetamine (VYVANSE) 60 MG capsule    Sig: Take 1 capsule (60 mg total) by mouth every morning. For 60d after signed    Dispense:  30 capsule    Refill:  0  . lisdexamfetamine (VYVANSE) 60 MG capsule    Sig: Take 1 capsule (60 mg total) by mouth every morning. For 30d after signed    Dispense:  30 capsule    Refill:  0  . lisdexamfetamine (VYVANSE) 60 MG capsule    Sig: Take 1 capsule (60 mg total) by mouth daily.    Dispense:  30 capsule    Refill:  0  . HYDROcodone-homatropine (HYCODAN) 5-1.5 MG/5ML syrup    Sig: Take 5 mLs by mouth every 6 (six) hours as needed.    Dispense:  120 mL    Refill:  0    I have completed the patient encounter in its entirety as documented by the scribe, with editing by me where necessary. Emma Rojas, M.D.

## 2015-08-17 NOTE — Patient Instructions (Signed)
     IF you received an x-ray today, you will receive an invoice from [facility]. Please contact  Radiology at [phone] with questions or concerns regarding your invoice.   IF you received labwork today, you will receive an invoice from [facility]. Please contact [facility] at [phone] with questions or concerns regarding your invoice.   Our billing staff will not be able to assist you with questions regarding bills from these companies.  You will be contacted with the lab results as soon as they are available. The fastest way to get your results is to activate your My Chart account. Instructions are located on the last page of this paperwork. If you have not heard from us regarding the results in 2 weeks, please contact this office.      

## 2015-08-19 LAB — CULTURE, GROUP A STREP: Organism ID, Bacteria: NORMAL

## 2015-11-23 ENCOUNTER — Other Ambulatory Visit: Payer: Self-pay

## 2015-11-23 DIAGNOSIS — F988 Other specified behavioral and emotional disorders with onset usually occurring in childhood and adolescence: Secondary | ICD-10-CM

## 2015-11-23 NOTE — Telephone Encounter (Signed)
Pt would like a refill on her lisdexamfetamine (VYVANSE) 60 MG capsule [[id number]][[id number]]. Please advise at [phone]

## 2015-11-28 NOTE — Telephone Encounter (Signed)
Pt last seen by Merla Richesoolittle 5/10 and given 3 Rxs. Following note was in OV under Plan:  ADD (attention deficit disorder) - Plan: lisdexamfetamine (VYVANSE) 60 MG capsulex 3 months--may call 3 and f/u 6 w/ S Weber  I have pended 3 more scripts.

## 2015-11-28 NOTE — Telephone Encounter (Signed)
Pt called to check the status rx refill  [phone]

## 2015-11-29 MED ORDER — LISDEXAMFETAMINE DIMESYLATE 60 MG PO CAPS: 60.0000 mg | ORAL_CAPSULE | ORAL | 0 refills | Status: DC

## 2015-11-29 MED ORDER — LISDEXAMFETAMINE DIMESYLATE 60 MG PO CAPS
60.0000 mg | ORAL_CAPSULE | Freq: Every day | ORAL | 0 refills | Status: DC
Start: 2015-11-29 — End: 2016-03-08

## 2015-11-29 NOTE — Telephone Encounter (Signed)
Meds ordered this encounter  Medications  . lisdexamfetamine (VYVANSE) 60 MG capsule    Sig: Take 1 capsule (60 mg total) by mouth daily.    Dispense:  30 capsule    Refill:  0  . lisdexamfetamine (VYVANSE) 60 MG capsule    Sig: Take 1 capsule (60 mg total) by mouth every morning. For 60d after signed    Dispense:  30 capsule    Refill:  0  . lisdexamfetamine (VYVANSE) 60 MG capsule    Sig: Take 1 capsule (60 mg total) by mouth every morning. For 30d after signed    Dispense:  30 capsule    Refill:  0   Please make sure that patient understands new same-day appointment system and need to establish with a new PCP when she needs the next fill.

## 2015-11-29 NOTE — Telephone Encounter (Signed)
LMOM to advise Rxs ready, need for f/up before more needed, and explained same day appts.

## 2016-02-22 ENCOUNTER — Other Ambulatory Visit: Payer: Self-pay | Admitting: Family Medicine

## 2016-02-22 DIAGNOSIS — I10 Essential (primary) hypertension: Secondary | ICD-10-CM

## 2016-03-08 ENCOUNTER — Ambulatory Visit (INDEPENDENT_AMBULATORY_CARE_PROVIDER_SITE_OTHER): Payer: BLUE CROSS/BLUE SHIELD | Admitting: Family Medicine

## 2016-03-08 VITALS — BP 130/86 | HR 83 | Temp 98.1°F | Resp 16 | Ht 63.5 in | Wt 274.4 lb

## 2016-03-08 DIAGNOSIS — F909 Attention-deficit hyperactivity disorder, unspecified type: Secondary | ICD-10-CM

## 2016-03-08 DIAGNOSIS — F988 Other specified behavioral and emotional disorders with onset usually occurring in childhood and adolescence: Secondary | ICD-10-CM

## 2016-03-08 DIAGNOSIS — Z23 Encounter for immunization: Secondary | ICD-10-CM

## 2016-03-08 MED ORDER — LISDEXAMFETAMINE DIMESYLATE 60 MG PO CAPS: 60.0000 mg | ORAL_CAPSULE | Freq: Every day | ORAL | 0 refills | Status: DC

## 2016-03-08 MED ORDER — LISDEXAMFETAMINE DIMESYLATE 60 MG PO CAPS: 60.0000 mg | ORAL_CAPSULE | ORAL | 0 refills | Status: DC

## 2016-03-08 MED ORDER — LISDEXAMFETAMINE DIMESYLATE 60 MG PO CAPS
60.0000 mg | ORAL_CAPSULE | ORAL | 0 refills | Status: DC
Start: 2016-03-08 — End: 2016-06-06

## 2016-03-08 NOTE — Progress Notes (Signed)
Patient ID: Emma SoursSarah C Quito, female    DOB: 1980-06-07, 35 y.o.   MRN: [id number]  PCP: Tonye PearsonOLITTLE, ROBERT P, MD (Inactive)  Chief Complaint  Patient presents with  . Medication Refill    VYVANSE    Subjective:   HPI 35 year old female presents for evaluation of ADHD medication. She is a former Dr. Merla Richesoolittle patient and [facility]. Originally diagnosed with ADHD at age 35 or 35 years old. Has taken Vyvanse for 1 year and 1/2 and works very well. 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 Reports no mood swings, able to focus, and feels very "balanced".  Denies medication side effect. Reports no sleep disturbance only due to extended hours of work not medication. Drinks an average of 5 alcohol beverages weekly and denies use of illicit drugs. She plans to establish with Dr. Dallas [facility]eland [facility] and appointment for a complete physical exam.   Social History   Social History  . Marital status: Single    Spouse name: N/A  . Number of children: 0  . Years of education: N/A   Occupational History  .  [employer]   Social History Main Topics  . Smoking status: Former Smoker    Quit date: 03/02/2008  . Smokeless tobacco: Not on file  . Alcohol use 2.5 oz/week    5 drink(s) per week  . Drug use: No  . Sexual activity: No   Other Topics Concern  . Not on file   Social History Narrative  . No narrative on file   Review of Systems HPI  Patient Active Problem List   Diagnosis Date Noted  . Thyroid activity decreased 04/20/2014  . BMI 40.0-44.9, adult (HCC) 09/19/2012  . Hypertension 05/16/2011  . Attention deficit hyperactivity disorder (ADHD) 05/16/2011     Prior to Admission medications   Medication Sig Start Date End Date Taking? Authorizing Provider  bisoprolol-hydrochlorothiazide [facility](ZIAC) 5-6.25 MG tablet take 1 tablet by mouth once daily 02/22/16  Yes Gwenlyn FoundJessica C  Copland, MD  levothyroxine (SYNTHROID, LEVOTHROID) 50 MCG tablet Take 1 tablet (50 mcg total) by mouth daily before breakfast. 05/23/15  Yes Gwenlyn FoundJessica C Copland, MD  lisdexamfetamine (VYVANSE) 60 MG capsule Take 1 capsule (60 mg total) by mouth daily. 11/29/15  Yes Chelle Jeffery, PA-C  lisdexamfetamine (VYVANSE) 60 MG capsule Take 1 capsule (60 mg total) by mouth every morning. For 60d after signed 11/29/15  Yes Chelle Jeffery, PA-C  lisdexamfetamine (VYVANSE) 60 MG capsule Take 1 capsule (60 mg total) by mouth every morning. For 30d after signed 11/29/15  Yes Porfirio Oarhelle Jeffery, PA-C    Allergies  Allergen Reactions  . Amoxicillin Hives       Objective:  Physical Exam  Constitutional: She appears well-developed and well-nourished.  HENT:  Head: Normocephalic.  Cardiovascular: Normal rate, regular rhythm, normal heart sounds and intact distal pulses.   Pulmonary/Chest: Effort normal and breath sounds normal.  Skin: Skin is warm and dry.  Psychiatric: She has a normal mood and affect. Her behavior is normal. Judgment and thought content normal.   Vitals:   03/08/16 0832  BP: 130/86  Pulse: 83  Resp: 16  Temp: 98.1 F (36.7 C)     Assessment & Plan:  1. Attention deficit hyperactivity disorder (ADHD), unspecified ADHD type Plan: . lisdexamfetamine (VYVANSE) 60 MG capsule    Sig: Take 1 capsule (60 mg total) by mouth  daily. Do not refill after 04/07/16  . lisdexamfetamine (VYVANSE) 60 MG capsule    Sig: Take 1 capsule (60 mg total) by mouth every morning. Do fill before 04/08/16 or after 05/07/16  . lisdexamfetamine (VYVANSE) 60 MG capsule    Sig: Take 1 capsule (60 mg total) by mouth every morning. Do not fill before 05/07/16   2. Need for prophylactic vaccination and inoculation against influenza - Flu Vaccine QUAD 36+ mos PF IM (Fluarix & Fluzone Quad PF)  Return for follow-up in 6 months. May call office or submit a refill request via myChart in 3 months for next  refills.  Godfrey PickKimberly S. Tiburcio PeaHarris, MSN, FNP-C [facility]

## 2016-03-08 NOTE — Patient Instructions (Addendum)
Return for follow-up in 6 months. May call office or submit a refill request via myChart in 3 months for next refills.    IF you received an x-ray today, you will receive an invoice from [facility]. Please contact [facility] at [phone] with questions or concerns regarding your invoice.   IF you received labwork today, you will receive an invoice from [facility]. Please contact [facility] at [phone] with questions or concerns regarding your invoice.   Our billing staff will not be able to assist you with questions regarding bills from these companies.  You will be contacted with the lab results as soon as they are available. The fastest way to get your results is to activate your My Chart account. Instructions are located on the last page of this paperwork. If you have not heard from us regarding the results in 2 weeks, please contact this office.

## 2016-04-13 ENCOUNTER — Telehealth: Payer: Self-pay | Admitting: Behavioral Health

## 2016-04-13 NOTE — Telephone Encounter (Signed)
Unable to reach patient at time of Pre-Visit Call.  Left message for patient to return call when available.    

## 2016-04-16 ENCOUNTER — Ambulatory Visit: Payer: Self-pay | Admitting: Family Medicine

## 2016-05-23 ENCOUNTER — Ambulatory Visit: Payer: Self-pay | Admitting: Family Medicine

## 2016-05-23 ENCOUNTER — Ambulatory Visit: Payer: BLUE CROSS/BLUE SHIELD | Admitting: Family Medicine

## 2016-05-25 ENCOUNTER — Other Ambulatory Visit: Payer: Self-pay | Admitting: Family Medicine

## 2016-05-25 DIAGNOSIS — E038 Other specified hypothyroidism: Secondary | ICD-10-CM

## 2016-05-30 ENCOUNTER — Encounter: Payer: Self-pay | Admitting: Family Medicine

## 2016-05-31 ENCOUNTER — Ambulatory Visit: Payer: BLUE CROSS/BLUE SHIELD | Admitting: Family Medicine

## 2016-05-31 ENCOUNTER — Telehealth: Payer: Self-pay | Admitting: Internal Medicine

## 2016-05-31 NOTE — Telephone Encounter (Signed)
No charge. 

## 2016-05-31 NOTE — Telephone Encounter (Signed)
Pt called in on 05/30/16 to cancel and reschedule her appt. When called pt back to reschedule. No answer. Left pt a vm asking her to call back to get appt rescheduled.     Spoke with pt today 05/31/16, rescheduled appt. Stated that she had a time getting through yesterday evening to reschedule.    Pt rescheduled for 06/06/16.    Should pt be charged for missed appt ?

## 2016-05-31 NOTE — Progress Notes (Deleted)
[facility] [address] [place], [place] [phone] [phone]  Date:  05/31/2016   Name:  Emma Rojas   DOB:  19-Jan-1981   MRN:  [id number]  PCP:  Tonye PearsonOLITTLE, ROBERT P, MD (Inactive)    Chief Complaint: No chief complaint on file.   History of Present Illness:  Emma SoursSarah C Rojas is a 36 y.o. very pleasant female patient who presents with the following:  Here today needing a refill of Vyvanse. Indication for chronic stimulatn: ADHD Medication and dose: vyvanse 60 once a day # pills per month: 30 Last UDS date: new pt Pain contract signed (Y/N): new pt Date narcotic database last reviewed (include red flags): today  NCCSR reviewed, with the following results:   Last fills dated 12/18, 11/30, 9/22 per Dr. Tiburcio PeaHarris  Patient Active Problem List   Diagnosis Date Noted  . Thyroid activity decreased 04/20/2014  . BMI 40.0-44.9, adult (HCC) 09/19/2012  . Hypertension 05/16/2011  . Attention deficit hyperactivity disorder (ADHD) 05/16/2011    Past Medical History:  Diagnosis Date  . ADD (attention deficit disorder with hyperactivity)   . Allergy   . Anemia   . Asthma   . Hypertension   . Thyroid disease     Past Surgical History:  Procedure Laterality Date  . TONSILLECTOMY AND ADENOIDECTOMY      Social History  Substance Use Topics  . Smoking status: Former Smoker    Quit date: 03/02/2008  . Smokeless tobacco: Not on file  . Alcohol use 2.5 oz/week    5 drink(s) per week    Family History  Problem Relation Age of Onset  . Prostate cancer Father     Allergies  Allergen Reactions  . Amoxicillin Hives    Medication list has been reviewed and updated.  Current Outpatient Prescriptions on File Prior to Visit  Medication Sig Dispense Refill  . bisoprolol-hydrochlorothiazide (ZIAC) 5-6.25 MG tablet take 1 tablet by mouth once daily 90 tablet 2  . levothyroxine (SYNTHROID, LEVOTHROID) 50 MCG tablet  take 1 tablet by mouth once daily BEFORE BREAKFAST 90 tablet 3  . lisdexamfetamine (VYVANSE) 60 MG capsule Take 1 capsule (60 mg total) by mouth daily. Do not refill after 04/07/16 30 capsule 0  . lisdexamfetamine (VYVANSE) 60 MG capsule Take 1 capsule (60 mg total) by mouth every morning. Do fill before 04/08/16 or after 05/07/16 30 capsule 0  . lisdexamfetamine (VYVANSE) 60 MG capsule Take 1 capsule (60 mg total) by mouth every morning. Do not fill before 05/07/16 30 capsule 0   No current facility-administered medications on file prior to visit.     Review of Systems:  ***  Physical Examination: There were no vitals filed for this visit. There were no vitals filed for this visit. There is no height or weight on file to calculate BMI. Ideal Body Weight:    ***  Assessment and Plan: ***  Signed Abbe AmsterdamJessica Copland, MD

## 2016-06-06 ENCOUNTER — Ambulatory Visit (INDEPENDENT_AMBULATORY_CARE_PROVIDER_SITE_OTHER): Payer: BLUE CROSS/BLUE SHIELD | Admitting: Family Medicine

## 2016-06-06 VITALS — BP 134/88 | HR 77 | Temp 98.2°F | Ht 63.6 in | Wt 277.8 lb

## 2016-06-06 DIAGNOSIS — E034 Atrophy of thyroid (acquired): Secondary | ICD-10-CM

## 2016-06-06 DIAGNOSIS — F988 Other specified behavioral and emotional disorders with onset usually occurring in childhood and adolescence: Secondary | ICD-10-CM

## 2016-06-06 DIAGNOSIS — Z130 Encounter for screening for diseases of the blood and blood-forming organs and certain disorders involving the immune mechanism: Secondary | ICD-10-CM

## 2016-06-06 DIAGNOSIS — E669 Obesity, unspecified: Secondary | ICD-10-CM

## 2016-06-06 DIAGNOSIS — Z131 Encounter for screening for diabetes mellitus: Secondary | ICD-10-CM | POA: Diagnosis not present

## 2016-06-06 LAB — COMPREHENSIVE METABOLIC PANEL
ALBUMIN: 3.9 g/dL (ref 3.5–5.2)
ALK PHOS: 62 U/L (ref 39–117)
ALT: 36 U/L — AB (ref 0–35)
AST: 22 U/L (ref 0–37)
BILIRUBIN TOTAL: 0.3 mg/dL (ref 0.2–1.2)
BUN: 10 mg/dL (ref 6–23)
CALCIUM: 9.1 mg/dL (ref 8.4–10.5)
CO2: 29 meq/L (ref 19–32)
CREATININE: 0.62 mg/dL (ref 0.40–1.20)
Chloride: 103 mEq/L (ref 96–112)
GFR: 115.58 mL/min (ref >60.00)
Glucose, Bld: 91 mg/dL (ref 70–99)
Potassium: 3.6 mEq/L (ref 3.5–5.1)
Sodium: 138 mEq/L (ref 135–145)
TOTAL PROTEIN: 7.3 g/dL (ref 6.0–8.3)

## 2016-06-06 LAB — CBC
HCT: 38.3 % (ref 36.0–46.0)
HEMOGLOBIN: 13 g/dL (ref 12.0–15.0)
MCHC: 34 g/dL (ref 30.0–36.0)
MCV: 92.6 fl (ref 78.0–100.0)
PLATELETS: 348 10*3/uL (ref 150.0–400.0)
RBC: 4.14 Mil/uL (ref 3.87–5.11)
RDW: 13.8 % (ref 11.5–15.5)
WBC: 10.6 10*3/uL — AB (ref 4.0–10.5)

## 2016-06-06 LAB — TSH: TSH: 1.59 u[IU]/mL (ref 0.35–4.50)

## 2016-06-06 LAB — HEMOGLOBIN A1C: HEMOGLOBIN A1C: 5.5 % (ref 4.6–6.5)

## 2016-06-06 MED ORDER — LISDEXAMFETAMINE DIMESYLATE 60 MG PO CAPS: 60.0000 mg | ORAL_CAPSULE | Freq: Every day | ORAL | 0 refills | Status: DC

## 2016-06-06 MED ORDER — LISDEXAMFETAMINE DIMESYLATE 60 MG PO CAPS: 60.0000 mg | ORAL_CAPSULE | ORAL | 0 refills | Status: DC

## 2016-06-06 NOTE — Progress Notes (Signed)
[facility] [address] [place], [place] [address] [phone]  Date:  06/06/2016   Name:  Emma Rojas   DOB:  21-Dec-1980   MRN:  [id number]  PCP:  Abbe Amsterdam, MD    Chief Complaint: Medication Refill (Pt here for med refill on Vyvanse. )   History of Present Illness:  Emma Rojas is a 36 y.o. very pleasant female patient who presents with the following:  Here today to discuss her vyvanse treatment- I last saw here about one year ago at [facility] She has been on vyvanse for   NCCSR:  Last rx was 12/28 and then 11/30 by Jerrilyn Cairo, then 9/22 by Porfirio Oar at [facility] No other entries   She is doing well on her current regimen on vyvanse.  Her sleep and appetite are good She works in [employer]- [employer] She is not fasting today   She is stable on her BP medication She is an OBG   LMP 2/12  She knows that she is overweight- recently got a promotion at work so she is traveling more and admits to getting less exercise.  She is interested in improving her health and losing weight- we discussed strategies   BP Readings from Last 3 Encounters:  06/06/16 134/88  03/08/16 130/86  08/17/15 124/78     Lab Results  Component Value Date   TSH 3.20 05/23/2015    Patient Active Problem List   Diagnosis Date Noted  . Thyroid activity decreased 04/20/2014  . BMI 40.0-44.9, adult (HCC) 09/19/2012  . Hypertension 05/16/2011  . Attention deficit hyperactivity disorder (ADHD) 05/16/2011    Past Medical History:  Diagnosis Date  . ADD (attention deficit disorder with hyperactivity)   . Allergy   . Anemia   . Asthma   . Hypertension   . Thyroid disease     Past Surgical History:  Procedure Laterality Date  . TONSILLECTOMY AND ADENOIDECTOMY      Social History  Substance Use Topics  . Smoking status: Former Smoker    Quit date: 03/02/2008  . Smokeless tobacco: Not on file  . Alcohol use 2.5  oz/week    5 drink(s) per week    Family History  Problem Relation Age of Onset  . Prostate cancer Father     Allergies  Allergen Reactions  . Amoxicillin Hives    Medication list has been reviewed and updated.  Current Outpatient Prescriptions on File Prior to Visit  Medication Sig Dispense Refill  . bisoprolol-hydrochlorothiazide (ZIAC) 5-6.25 MG tablet take 1 tablet by mouth once daily 90 tablet 2  . levothyroxine (SYNTHROID, LEVOTHROID) 50 MCG tablet take 1 tablet by mouth once daily BEFORE BREAKFAST 90 tablet 3   No current facility-administered medications on file prior to visit.     Review of Systems:  As per HPI- otherwise negative.   Physical Examination: Vitals:   06/06/16 1302  BP: 134/88  Pulse: 77  Temp: 98.2 F (36.8 C)   Vitals:   06/06/16 1302  Weight: 277 lb 12.8 oz (126 kg)  Height: 5' 3.6" (1.615 m)   Body mass index is 48.29 kg/m. Ideal Body Weight: Weight in (lb) to have BMI = 25: 143.5  GEN: WDWN, NAD, Non-toxic, A & O x 3, obese, otherwise looks well HEENT: Atraumatic, Normocephalic. Neck supple. No masses, No LAD. Ears and Nose: No external deformity. CV: RRR, No M/G/R. No JVD. No thrill. No  extra heart sounds. PULM: CTA B, no wheezes, crackles, rhonchi. No retractions. No resp. distress. No accessory muscle use. ABD: S, NT, ND, +BS. No rebound. No HSM. EXTR: No c/c/e NEURO Normal gait.  PSYCH: Normally interactive. Conversant. Not depressed or anxious appearing.  Calm demeanor.    Assessment and Plan: Attention deficit disorder, unspecified hyperactivity presence - Plan: lisdexamfetamine (VYVANSE) 60 MG capsule, lisdexamfetamine (VYVANSE) 60 MG capsule, lisdexamfetamine (VYVANSE) 60 MG capsule  Screening for deficiency anemia - Plan: CBC  Screening for diabetes mellitus - Plan: Comprehensive metabolic panel, Hemoglobin A1c  Hypothyroidism due to acquired atrophy of thyroid - Plan: TSH  Obesity, unspecified classification,  unspecified obesity type, unspecified whether serious comorbidity present  Here today to follow-up on her vyvanse and a few other concerns UDS, contract today Refilled vyvanse for 3 months Labs pending as above Her BP is under ok control Encouraged her to work on weight loss and discussed some ways to go about this.  She will see me in 6 months to check on her progress Will plan further follow- up pending labs.    Signed Abbe AmsterdamJessica Phenix Grein, MD

## 2016-06-06 NOTE — Patient Instructions (Signed)
It was very nice to see you today- [place]   We will check your labs today; I'll be in touch with your results asap Let me know when you need more of your vyvanse in about 3 months; we will plan to recheck in the office in 6 months

## 2016-06-07 ENCOUNTER — Encounter: Payer: Self-pay | Admitting: Family Medicine

## 2016-06-08 ENCOUNTER — Ambulatory Visit: Payer: Self-pay | Admitting: Family Medicine

## 2016-06-26 ENCOUNTER — Encounter: Payer: Self-pay | Admitting: Family Medicine

## 2016-06-26 ENCOUNTER — Telehealth: Payer: BLUE CROSS/BLUE SHIELD | Admitting: Nurse Practitioner

## 2016-06-26 DIAGNOSIS — J019 Acute sinusitis, unspecified: Secondary | ICD-10-CM

## 2016-06-26 MED ORDER — FLUTICASONE PROPIONATE 50 MCG/ACT NA SUSP: 2.0000 | Freq: Every day | NASAL | 0 refills | Status: AC

## 2016-06-26 NOTE — Telephone Encounter (Signed)
Patient completed e-visit around 11am today.

## 2016-06-26 NOTE — Progress Notes (Signed)

## 2016-07-09 ENCOUNTER — Ambulatory Visit: Payer: BLUE CROSS/BLUE SHIELD | Admitting: Family Medicine

## 2016-09-15 ENCOUNTER — Encounter: Payer: Self-pay | Admitting: Family Medicine

## 2016-09-17 ENCOUNTER — Other Ambulatory Visit: Payer: Self-pay | Admitting: Emergency Medicine

## 2016-09-17 ENCOUNTER — Encounter: Payer: Self-pay | Admitting: Family Medicine

## 2016-09-17 DIAGNOSIS — F988 Other specified behavioral and emotional disorders with onset usually occurring in childhood and adolescence: Secondary | ICD-10-CM

## 2016-09-17 MED ORDER — LISDEXAMFETAMINE DIMESYLATE 60 MG PO CAPS: 60.0000 mg | ORAL_CAPSULE | Freq: Every day | ORAL | 0 refills | Status: DC

## 2016-09-17 MED ORDER — LISDEXAMFETAMINE DIMESYLATE 60 MG PO CAPS: 60.0000 mg | ORAL_CAPSULE | ORAL | 0 refills | Status: DC

## 2016-09-17 NOTE — Telephone Encounter (Signed)
Seen in February-  Here today to follow-up on her vyvanse and a few other concerns UDS, contract today Refilled vyvanse for 3 months UDS was low risk NCCSR: nothing unexpected

## 2016-09-17 NOTE — Telephone Encounter (Signed)
Requesting: lisdexamfetamine (VYVANSE) 60 MG capsule Contract: 06/06/16 no controlled substance contract signed UDS:  uds sample given, low risk next screen 12/04/16. Last OV: 06/06/16 Last Refill: 06/06/16  Please Advise

## 2016-11-02 ENCOUNTER — Other Ambulatory Visit: Payer: Self-pay | Admitting: Family Medicine

## 2016-11-02 DIAGNOSIS — I10 Essential (primary) hypertension: Secondary | ICD-10-CM

## 2016-11-05 ENCOUNTER — Other Ambulatory Visit: Payer: Self-pay | Admitting: Family Medicine

## 2016-11-05 DIAGNOSIS — I10 Essential (primary) hypertension: Secondary | ICD-10-CM

## 2016-11-05 NOTE — Telephone Encounter (Signed)
Last filled: 02/22/16 Amt: 90,2 Last OV: 06/06/16 Need 6 month follow up.  30 day supply sent to pharmacy.

## 2016-12-02 NOTE — Progress Notes (Deleted)
[facility] [address], [address] [place], [place] [address] [phone]  Date:  12/03/2016   Name:  Emma Rojas   DOB:  [birth year]   MRN:  [id number]  PCP:  Pearline Cables, MD    Chief Complaint: No chief complaint on file.   History of Present Illness:  Emma Rojas is a 36 y.o. very pleasant female patient who presents with the following:  Recheck visit today History of obesity, HTN, ADHD We need to refill her vyvanse today Last labs in February   Tetanus: Pap: Flu shot:  Lab Results  Component Value Date   TSH 1.59 06/06/2016   NCCSR: she filled vyvanse in June and in March of this year, no unexpected entries  BP Readings from Last 3 Encounters:  06/06/16 134/88  03/08/16 130/86  08/17/15 124/78     Patient Active Problem List   Diagnosis Date Noted  . Thyroid activity decreased 04/20/2014  . BMI 40.0-44.9, adult (HCC) 09/19/2012  . Hypertension 05/16/2011  . Attention deficit hyperactivity disorder (ADHD) 05/16/2011    Past Medical History:  Diagnosis Date  . ADD (attention deficit disorder with hyperactivity)   . Allergy   . Anemia   . Asthma   . Hypertension   . Thyroid disease     Past Surgical History:  Procedure Laterality Date  . TONSILLECTOMY AND ADENOIDECTOMY      Social History  Substance Use Topics  . Smoking status: Former Smoker    Quit date: 03/02/2008  . Smokeless tobacco: Not on file  . Alcohol use 2.5 oz/week    5 drink(s) per week    Family History  Problem Relation Age of Onset  . Prostate cancer Father     Allergies  Allergen Reactions  . Amoxicillin Hives    Medication list has been reviewed and updated.  Current Outpatient Prescriptions on File Prior to Visit  Medication Sig Dispense Refill  . bisoprolol-hydrochlorothiazide (ZIAC) 5-6.25 MG tablet Take 1 tablet by mouth daily. Please call office to schedule a follow up as soon as possible. 30  tablet 0  . fluticasone (FLONASE) 50 MCG/ACT nasal spray Place 2 sprays into both nostrils daily. 16 g 0  . levothyroxine (SYNTHROID, LEVOTHROID) 50 MCG tablet take 1 tablet by mouth once daily BEFORE BREAKFAST 90 tablet 3  . lisdexamfetamine (VYVANSE) 60 MG capsule Take 1 capsule (60 mg total) by mouth daily. 30 capsule 0  . lisdexamfetamine (VYVANSE) 60 MG capsule Take 1 capsule (60 mg total) by mouth every morning. Ok to fill in 30 days 30 capsule 0  . lisdexamfetamine (VYVANSE) 60 MG capsule Take 1 capsule (60 mg total) by mouth every morning. Ok to fill in 60 days 30 capsule 0   No current facility-administered medications on file prior to visit.     Review of Systems:  As per HPI- otherwise negative.   Physical Examination: There were no vitals filed for this visit. There were no vitals filed for this visit. There is no height or weight on file to calculate BMI. Ideal Body Weight:    GEN: WDWN, NAD, Non-toxic, A & O x 3 HEENT: Atraumatic, Normocephalic. Neck supple. No masses, No LAD. Ears and Nose: No external deformity. CV: RRR, No M/G/R. No JVD. No thrill. No extra heart sounds. PULM: CTA B, no wheezes, crackles, rhonchi. No retractions. No resp. distress. No accessory muscle use. ABD: S, NT, ND, +BS. No rebound. No  HSM. EXTR: No c/c/e NEURO Normal gait.  PSYCH: Normally interactive. Conversant. Not depressed or anxious appearing.  Calm demeanor.    Assessment and Plan: ***  Signed Abbe AmsterdamJessica Delsy Etzkorn, MD

## 2016-12-03 ENCOUNTER — Ambulatory Visit: Payer: Self-pay | Admitting: Family Medicine

## 2016-12-08 ENCOUNTER — Other Ambulatory Visit: Payer: Self-pay | Admitting: Family Medicine

## 2016-12-08 DIAGNOSIS — I10 Essential (primary) hypertension: Secondary | ICD-10-CM

## 2016-12-11 MED ORDER — BISOPROLOL-HYDROCHLOROTHIAZIDE 5-6.25 MG PO TABS: 1.0000 | ORAL_TABLET | Freq: Every day | ORAL | 0 refills | Status: DC

## 2016-12-14 NOTE — Progress Notes (Signed)
[facility] [address] [place], [place] [id number] [phone] [phone]  Date:  12/17/2016   Name:  Emma SoursSarah C Hosman   DOB:  10-31-1980   MRN:  [id number]  PCP:  Pearline Cablesopland, Corran Lalone C, MD    Chief Complaint: Follow-up (Pt here for f/u visit. Flu vaccine given today. )   History of Present Illness:  Emma Rojas is a 36 y.o. very pleasant female patient who presents with the following:  Here today to recheck ADD I last saw her in February of this year  Here today to discuss her vyvanse treatment- I [facility] She has been on vyvanse for   NCCSR:  Last rx was 12/28 and then 11/30 by Jerrilyn CairoKim Harris, then 9/22 by [facility]fir[facility]helle Jeffery [facility] No other entries   She is doing well on her current regimen on vyvanse.  Her sleep and appetite are good She works in [employer]- [employer] She is not fasting today   She is stable on her BP medication  NCCSR: last filled Vyvanse in June of this year  She has been [place] a lot for [place]- [place] Flu shot done today She would like to try backing down on vyvanse to 50 mg from her current 60 mg as she has felt a little too stimulated, and feels like her heart is pounding if she drinks coffee/.    Sleeping can be tough due to all her travel- she has tried some CBD oil which does seem to help however Appetite is ok Her thyroid has been ok- she prefers to wait until next visit to get labs which is fine  She did a UDS in February , contract in February as well  BP Readings from Last 3 Encounters:  12/17/16 132/86  06/06/16 134/88  03/08/16 130/86   Wt Readings from Last 3 Encounters:  12/17/16 279 lb 12.8 oz (126.9 kg)  06/06/16 277 lb 12.8 oz (126 kg)  03/08/16 274 lb 6.4 oz (124.5 kg)     Lab Results  Component Value Date   TSH 1.59 06/06/2016     Patient Active Problem List   Diagnosis Date Noted  . Thyroid activity  decreased 04/20/2014  . BMI 40.0-44.9, adult (HCC) 09/19/2012  . Hypertension 05/16/2011  . Attention deficit hyperactivity disorder (ADHD) 05/16/2011    Past Medical History:  Diagnosis Date  . ADD (attention deficit disorder with hyperactivity)   . Allergy   . Anemia   . Asthma   . Hypertension   . Thyroid disease     Past Surgical History:  Procedure Laterality Date  . TONSILLECTOMY AND ADENOIDECTOMY      Social History  Substance Use Topics  . Smoking status: Former Smoker    Quit date: 03/02/2008  . Smokeless tobacco: Not on file  . Alcohol use 2.5 oz/week    5 drink(s) per week    Family History  Problem Relation Age of Onset  . Prostate cancer Father     Allergies  Allergen Reactions  . Amoxicillin Hives    Medication list has been reviewed and updated.  Current Outpatient Prescriptions on File Prior to Visit  Medication Sig Dispense Refill  . bisoprolol-hydrochlorothiazide (ZIAC) 5-6.25 MG tablet Take 1 tablet by mouth daily. 30 tablet 0  . levothyroxine (SYNTHROID, LEVOTHROID) 50 MCG tablet take 1 tablet by mouth once daily BEFORE BREAKFAST 90  tablet 3  . fluticasone (FLONASE) 50 MCG/ACT nasal spray Place 2 sprays into both nostrils daily. 16 g 0   No current facility-administered medications on file prior to visit.     Review of Systems:  As per HPI- otherwise negative. No fever or chills No weight loss No rash, CP or SOB   Physical Examination: Vitals:   12/17/16 1136  BP: 132/86  Pulse: 73  Temp: 98.4 F (36.9 C)  SpO2: 99%   Vitals:   12/17/16 1136  Weight: 279 lb 12.8 oz (126.9 kg)  Height: 5' 3.5" (1.613 m)   Body mass index is 48.79 kg/m. Ideal Body Weight: Weight in (lb) to have BMI = 25: 143.1  GEN: WDWN, NAD, Non-toxic, A & O x 3, obese, otherwise looks well HEENT: Atraumatic, Normocephalic. Neck supple. No masses, No LAD. Ears and Nose: No external deformity. CV: RRR, No M/G/R. No JVD. No thrill. No extra heart  sounds. PULM: CTA B, no wheezes, crackles, rhonchi. No retractions. No resp. distress. No accessory muscle use. ABD: S, NT, ND, +BS. No rebound. No HSM. EXTR: No c/c/e NEURO Normal gait.  PSYCH: Normally interactive. Conversant. Not depressed or anxious appearing.  Calm demeanor.    Assessment and Plan: Immunization due - Plan: Flu Vaccine QUAD 36+ mos IM (Fluarix & Fluzone Quad PF  Attention deficit disorder, unspecified hyperactivity presence - Plan: lisdexamfetamine (VYVANSE) 50 MG capsule, lisdexamfetamine (VYVANSE) 50 MG capsule, lisdexamfetamine (VYVANSE) 50 MG capsule  Medication review- vyvanse- today.  Reduced dose to 50 mg  She will see me in February for labs and a recheck Flu shot today She will let me know if the 50 mg is not effective for her   Signed Abbe Amsterdam, MD

## 2016-12-17 ENCOUNTER — Ambulatory Visit (INDEPENDENT_AMBULATORY_CARE_PROVIDER_SITE_OTHER): Payer: BLUE CROSS/BLUE SHIELD | Admitting: Family Medicine

## 2016-12-17 VITALS — BP 132/86 | HR 73 | Temp 98.4°F | Ht 63.5 in | Wt 279.8 lb

## 2016-12-17 DIAGNOSIS — F988 Other specified behavioral and emotional disorders with onset usually occurring in childhood and adolescence: Secondary | ICD-10-CM

## 2016-12-17 DIAGNOSIS — Z23 Encounter for immunization: Secondary | ICD-10-CM | POA: Diagnosis not present

## 2016-12-17 MED ORDER — LISDEXAMFETAMINE DIMESYLATE 50 MG PO CAPS: 50.0000 mg | ORAL_CAPSULE | Freq: Every day | ORAL | 0 refills | Status: DC

## 2016-12-17 MED ORDER — LISDEXAMFETAMINE DIMESYLATE 50 MG PO CAPS: 50.0000 mg | ORAL_CAPSULE | ORAL | 0 refills | Status: DC

## 2016-12-17 NOTE — Patient Instructions (Signed)
Please see me in about 6 months for a recheck and fasting labs I can refill your medication in 3 months.  Take care!

## 2016-12-23 ENCOUNTER — Encounter: Payer: Self-pay | Admitting: Family Medicine

## 2016-12-23 DIAGNOSIS — I10 Essential (primary) hypertension: Secondary | ICD-10-CM

## 2016-12-23 DIAGNOSIS — E038 Other specified hypothyroidism: Secondary | ICD-10-CM

## 2016-12-24 MED ORDER — LEVOTHYROXINE SODIUM 50 MCG PO TABS: 50.0000 ug | ORAL_TABLET | Freq: Every day | ORAL | 2 refills | Status: DC

## 2016-12-24 MED ORDER — BISOPROLOL-HYDROCHLOROTHIAZIDE 5-6.25 MG PO TABS: 1.0000 | ORAL_TABLET | Freq: Every day | ORAL | 2 refills | Status: DC

## 2017-01-11 ENCOUNTER — Encounter: Payer: Self-pay | Admitting: Family Medicine

## 2017-01-20 NOTE — Progress Notes (Signed)
[facility] [address] [place], [place] [address] [phone]  Date:  01/21/2017   Name:  Emma Rojas   DOB:  [birth year]   MRN:  [id number]  PCP:  Pearline Cables, MD    Chief Complaint: Follow-up (Pt here to have thyroid levels checked. )   History of Present Illness:  Emma Rojas is a 36 y.o. very pleasant female patient who presents with the following:  Here today for a thyroid follow-up I saw her last month to discuss her ADHD symptoms:  Here today to discuss her vyvanse treatment- I last saw here about one year ago at [facility]  She is doing well on her current regimen on vyvanse.  Her sleep and appetite are good She works in [employer]- [employer] She is not fasting today   She is stable on her BP medication  NCCSR: last filled Vyvanse in June of this year  She has been traveling a lot for work- [place] [employer] [employer] [place] Flu shot done today She would like to try backing down on vyvanse to 50 mg from her current 60 mg as she has felt a little too stimulated, and feels like her heart is pounding if she drinks coffee/.    Sleeping can be tough due to all her travel- she has tried some CBD oil which does seem to help however Appetite is ok Her thyroid has been ok- she prefers to wait until next visit to get labs which is fine  She did a UDS in February , contract in February as well  Lab Results  Component Value Date   TSH 1.59 06/06/2016   She had contacted me desiring a thyroid check.  It has been several months since she had her TSH done, and she has noted prolonged menstrual bleeding- 3 weeks. Seems to be tapering off now. She has had this issue in the past when her thyroid was abnormal  flu shot is done for the year  Pap: about 4 years ago, never had an abnl  She does have a GYN appt scheduled for later on this month and will have her pap   She is treated with Ziac for her HTN-  well controlled  Her menses are often irregular   Lab Results  Component Value Date   TSH 1.59 06/06/2016   She did get a different generic for her thyroid med a couple of months ago She is doing a whole 30 this month in an attempt to lose weight She had a couple of eggs and bacon this am    BP Readings from Last 3 Encounters:  01/21/17 116/88  12/17/16 132/86  06/06/16 134/88    Patient Active Problem List   Diagnosis Date Noted  . Thyroid activity decreased 04/20/2014  . BMI 40.0-44.9, adult (HCC) 09/19/2012  . Hypertension 05/16/2011  . Attention deficit hyperactivity disorder (ADHD) 05/16/2011    Past Medical History:  Diagnosis Date  . ADD (attention deficit disorder with hyperactivity)   . Allergy   . Anemia   . Asthma   . Hypertension   . Thyroid disease     Past Surgical History:  Procedure Laterality Date  . TONSILLECTOMY AND ADENOIDECTOMY      Social History  Substance Use Topics  . Smoking status: Former Smoker    Quit date: 03/02/2008  . Smokeless tobacco: Not on file  . Alcohol use 2.5 oz/week  5 drink(s) per week    Family History  Problem Relation Age of Onset  . Prostate cancer Father     Allergies  Allergen Reactions  . Amoxicillin Hives    Medication list has been reviewed and updated.  Current Outpatient Prescriptions on File Prior to Visit  Medication Sig Dispense Refill  . bisoprolol-hydrochlorothiazide (ZIAC) 5-6.25 MG tablet Take 1 tablet by mouth daily. 90 tablet 2  . levothyroxine (SYNTHROID, LEVOTHROID) 50 MCG tablet Take 1 tablet (50 mcg total) by mouth daily before breakfast. 90 tablet 2  . lisdexamfetamine (VYVANSE) 50 MG capsule Take 1 capsule (50 mg total) by mouth daily. 30 capsule 0  . lisdexamfetamine (VYVANSE) 50 MG capsule Take 1 capsule (50 mg total) by mouth every morning. Ok to fill in 30 days 30 capsule 0  . lisdexamfetamine (VYVANSE) 50 MG capsule Take 1 capsule (50 mg total) by mouth every morning. Ok to  fill in 60 days 30 capsule 0  . fluticasone (FLONASE) 50 MCG/ACT nasal spray Place 2 sprays into both nostrils daily. 16 g 0   No current facility-administered medications on file prior to visit.     Review of Systems:  As per HPI- otherwise negative.   Physical Examination: Vitals:   01/21/17 1234  BP: 116/88  Pulse: 87  Temp: 98.5 F (36.9 C)  SpO2: 98%   Vitals:   01/21/17 1234  Weight: 276 lb 9.6 oz (125.5 kg)  Height: 5' 3.5" (1.613 m)   Body mass index is 48.23 kg/m. Ideal Body Weight: Weight in (lb) to have BMI = 25: 143.1  GEN: WDWN, NAD, Non-toxic, A & O x 3, obese, otherwise looks well HEENT: Atraumatic, Normocephalic. Neck supple. No masses, No LAD. Ears and Nose: No external deformity. CV: RRR, No M/G/R. No JVD. No thrill. No extra heart sounds. PULM: CTA B, no wheezes, crackles, rhonchi. No retractions. No resp. distress. No accessory muscle use. ABD: S, NT, ND, +BS. No rebound. No HSM. EXTR: No c/c/e NEURO Normal gait.  PSYCH: Normally interactive. Conversant. Not depressed or anxious appearing.  Calm demeanor.    Assessment and Plan: Other specified hypothyroidism - Plan: TSH  Essential hypertension  Screening for deficiency anemia - Plan: CBC  Screening for diabetes mellitus - Plan: Comprehensive metabolic panel, Hemoglobin A1c  Screening for hyperlipidemia - Plan: Lipid panel  Here today for a thyroid check TSH and other labs pending as above Flu shot is done Per my recollection Emma Rojas is not SA with men, so there is no chance of pregnancy. However will confirm this with her today as well to make sure no risk   Signed Abbe Amsterdam, MD

## 2017-01-21 ENCOUNTER — Ambulatory Visit (INDEPENDENT_AMBULATORY_CARE_PROVIDER_SITE_OTHER): Payer: BLUE CROSS/BLUE SHIELD | Admitting: Family Medicine

## 2017-01-21 ENCOUNTER — Encounter: Payer: Self-pay | Admitting: Family Medicine

## 2017-01-21 VITALS — BP 116/88 | HR 87 | Temp 98.5°F | Ht 63.5 in | Wt 276.6 lb

## 2017-01-21 DIAGNOSIS — Z131 Encounter for screening for diabetes mellitus: Secondary | ICD-10-CM | POA: Diagnosis not present

## 2017-01-21 DIAGNOSIS — I10 Essential (primary) hypertension: Secondary | ICD-10-CM

## 2017-01-21 DIAGNOSIS — Z130 Encounter for screening for diseases of the blood and blood-forming organs and certain disorders involving the immune mechanism: Secondary | ICD-10-CM

## 2017-01-21 DIAGNOSIS — E038 Other specified hypothyroidism: Secondary | ICD-10-CM

## 2017-01-21 DIAGNOSIS — Z13220 Encounter for screening for lipoid disorders: Secondary | ICD-10-CM

## 2017-01-21 LAB — COMPREHENSIVE METABOLIC PANEL
ALT: 51 U/L — ABNORMAL HIGH (ref 0–35)
AST: 45 U/L — ABNORMAL HIGH (ref 0–37)
Albumin: 4.2 g/dL (ref 3.5–5.2)
Alkaline Phosphatase: 65 U/L (ref 39–117)
BUN: 11 mg/dL (ref 6–23)
CO2: 27 mEq/L (ref 19–32)
Calcium: 9.5 mg/dL (ref 8.4–10.5)
Chloride: 101 mEq/L (ref 96–112)
Creatinine, Ser: 0.87 mg/dL (ref 0.40–1.20)
GFR: 77.91 mL/min (ref >60.00)
Glucose, Bld: 103 mg/dL — ABNORMAL HIGH (ref 70–99)
Potassium: 4.2 mEq/L (ref 3.5–5.1)
Sodium: 136 mEq/L (ref 135–145)
Total Bilirubin: 0.5 mg/dL (ref 0.2–1.2)
Total Protein: 7.8 g/dL (ref 6.0–8.3)

## 2017-01-21 LAB — CBC
HCT: 35.4 % — ABNORMAL LOW (ref 36.0–46.0)
Hemoglobin: 12.1 g/dL (ref 12.0–15.0)
MCHC: 34.3 g/dL (ref 30.0–36.0)
MCV: 93.8 fl (ref 78.0–100.0)
Platelets: 418 10*3/uL — ABNORMAL HIGH (ref 150.0–400.0)
RBC: 3.77 Mil/uL — ABNORMAL LOW (ref 3.87–5.11)
RDW: 14.1 % (ref 11.5–15.5)
WBC: 9.6 10*3/uL (ref 4.0–10.5)

## 2017-01-21 LAB — LIPID PANEL
Cholesterol: 192 mg/dL (ref 0–200)
HDL: 48.9 mg/dL (ref >39.00)
LDL Cholesterol: 104 mg/dL — ABNORMAL HIGH (ref 0–99)
NonHDL: 143.42
Total CHOL/HDL Ratio: 4
Triglycerides: 198 mg/dL — ABNORMAL HIGH (ref 0.0–149.0)
VLDL: 39.6 mg/dL (ref 0.0–40.0)

## 2017-01-21 LAB — TSH: TSH: 3.36 u[IU]/mL (ref 0.35–4.50)

## 2017-01-21 LAB — HEMOGLOBIN A1C: Hgb A1c MFr Bld: 5.1 % (ref 4.6–6.5)

## 2017-01-21 NOTE — Patient Instructions (Signed)
It was nice to see you again today!  I'll be in touch with your labs When you see your GYN, you might mention getting an IUD- this can be a good way to stop menstrual bleeding

## 2017-01-23 ENCOUNTER — Encounter: Payer: Self-pay | Admitting: Family Medicine

## 2017-01-23 ENCOUNTER — Telehealth: Payer: Self-pay | Admitting: Family Medicine

## 2017-01-23 DIAGNOSIS — R7401 Elevation of levels of liver transaminase levels: Secondary | ICD-10-CM

## 2017-01-23 DIAGNOSIS — R740 Nonspecific elevation of levels of transaminase and lactic acid dehydrogenase [LDH]: Principal | ICD-10-CM

## 2017-01-23 NOTE — Telephone Encounter (Signed)
Received her labs, called to discuss.  Will also send her a Clinical cytogeneticistmychart message

## 2017-01-23 NOTE — Telephone Encounter (Signed)
Emma CarolinSarah Mccanless Self [phone]  Maralyn SagoSarah called to get lab results. Please call her with results

## 2017-03-29 ENCOUNTER — Encounter: Payer: Self-pay | Admitting: Family Medicine

## 2017-03-29 ENCOUNTER — Telehealth: Payer: Self-pay | Admitting: Family Medicine

## 2017-03-29 DIAGNOSIS — F988 Other specified behavioral and emotional disorders with onset usually occurring in childhood and adolescence: Secondary | ICD-10-CM

## 2017-03-29 MED ORDER — LISDEXAMFETAMINE DIMESYLATE 50 MG PO CAPS: 50.0000 mg | ORAL_CAPSULE | ORAL | 0 refills | Status: DC

## 2017-03-29 MED ORDER — LISDEXAMFETAMINE DIMESYLATE 50 MG PO CAPS: 50.0000 mg | ORAL_CAPSULE | Freq: Every day | ORAL | 0 refills | Status: DC

## 2017-03-29 NOTE — Telephone Encounter (Signed)
Please advise 

## 2017-03-29 NOTE — Telephone Encounter (Signed)
Copied from CRM [phone]#[id number]. Topic: Quick Communication - Rx Refill/Question >> Mar 29, 2017 11:03 AM Oneal GroutSebastian, Jennifer S wrote: Has the patient contacted their pharmacy? Yes.     (Agent: If no, request that the patient contact the pharmacy for the refill.)   Preferred Pharmacy (with phone number or street name): [facility]   Agent: Please be advised that RX refills may take up to 3 business days. We ask that you follow-up with your pharmacy. Requesting refill of lisdexamfetamine (VYVANSE) 50 MG capsule

## 2017-05-12 ENCOUNTER — Other Ambulatory Visit: Payer: Self-pay | Admitting: Family Medicine

## 2017-05-12 DIAGNOSIS — F988 Other specified behavioral and emotional disorders with onset usually occurring in childhood and adolescence: Secondary | ICD-10-CM

## 2017-05-13 ENCOUNTER — Encounter: Payer: Self-pay | Admitting: Family Medicine

## 2017-05-13 MED ORDER — LISDEXAMFETAMINE DIMESYLATE 50 MG PO CAPS: 50.0000 mg | ORAL_CAPSULE | ORAL | 0 refills | Status: DC

## 2017-05-13 MED ORDER — LISDEXAMFETAMINE DIMESYLATE 50 MG PO CAPS: 50.0000 mg | ORAL_CAPSULE | Freq: Every day | ORAL | 0 refills | Status: DC

## 2017-05-13 NOTE — Telephone Encounter (Signed)
Pt requesting refill on Vyvanse 50mg .

## 2017-05-13 NOTE — Telephone Encounter (Signed)
Last seen here on 10/18 NCCSR: last filled 12/18 No concerning findings  Ok to refill   Meds ordered this encounter  Medications  . lisdexamfetamine (VYVANSE) 50 MG capsule    Sig: Take 1 capsule (50 mg total) by mouth daily.    Dispense:  30 capsule    Refill:  0  . lisdexamfetamine (VYVANSE) 50 MG capsule    Sig: Take 1 capsule (50 mg total) by mouth every morning. Ok to fill in 60 days    Dispense:  30 capsule    Refill:  0  . lisdexamfetamine (VYVANSE) 50 MG capsule    Sig: Take 1 capsule (50 mg total) by mouth every morning. Ok to fill in 30 days    Dispense:  30 capsule    Refill:  0   Message to pt

## 2017-06-13 ENCOUNTER — Encounter: Payer: Self-pay | Admitting: Family Medicine

## 2017-06-13 DIAGNOSIS — F988 Other specified behavioral and emotional disorders with onset usually occurring in childhood and adolescence: Secondary | ICD-10-CM

## 2017-06-13 MED ORDER — LISDEXAMFETAMINE DIMESYLATE 50 MG PO CAPS: 50.0000 mg | ORAL_CAPSULE | Freq: Every day | ORAL | 0 refills | Status: DC

## 2017-06-14 ENCOUNTER — Telehealth: Payer: Self-pay

## 2017-06-14 NOTE — Telephone Encounter (Signed)
PA initiated via Covermymeds; KEY: [id number]. Awaiting determination.    PA approved- effective 05/15/2017 through 06/13/2020.

## 2017-09-10 ENCOUNTER — Encounter: Payer: Self-pay | Admitting: Family Medicine

## 2017-09-10 DIAGNOSIS — F988 Other specified behavioral and emotional disorders with onset usually occurring in childhood and adolescence: Secondary | ICD-10-CM

## 2017-09-11 MED ORDER — LISDEXAMFETAMINE DIMESYLATE 50 MG PO CAPS: 50.0000 mg | ORAL_CAPSULE | Freq: Every day | ORAL | 0 refills | Status: DC

## 2017-09-11 NOTE — Addendum Note (Signed)
Addended by: Abbe AmsterdamOPLAND, JESSICA C on: 09/11/2017 09:51 AM   Modules accepted: Orders

## 2017-09-17 ENCOUNTER — Other Ambulatory Visit: Payer: Self-pay | Admitting: Family Medicine

## 2017-09-17 DIAGNOSIS — I10 Essential (primary) hypertension: Secondary | ICD-10-CM

## 2017-09-19 ENCOUNTER — Encounter: Payer: BLUE CROSS/BLUE SHIELD | Admitting: Family Medicine

## 2017-09-24 NOTE — Progress Notes (Deleted)
[facility] [address], [address] [place], [place] [id number] [phone] [phone]  Date:  09/26/2017   Name:  Emma Rojas   DOB:  18-Sep-1980   MRN:  [id number]  PCP:  Pearline Cablesopland, Yariana Hoaglund C, MD    Chief Complaint: No chief complaint on file.   History of Present Illness:  Emma Rojas is a 37 y.o. very pleasant female patient who presents with the following:  Here today for a CE History of obesity, HTN, hypothyroidism, ADHD  Last seen by myself in October of last year She works in [employer] She is not fasting today    Lab Results  Component Value Date   TSH 3.36 01/21/2017   Labs: October Immun: may need tdap Pap:   NCCSR: 09/11/2017  1  09/11/2017  Vyvanse 50 Mg Capsule  30 30 Je Cop  [id number]  [place] ([id number])  0  Comm Ins  [place]  05/13/2017  1  05/13/2017  Vyvanse 50 Mg Capsule  30 30 Je Cop  [id number]  [place] ([id number])  0  Private Pay  [place]  03/29/2017  1  03/29/2017  Vyvanse 50 Mg Capsule  30 30 Je Cop  [id number]  [place] ([id number])  0  Comm Ins  [place]  02/25/2017  1  12/17/2016  Vyvanse 50 Mg Capsule  30 30 Je Cop  [id number]  [place] ([id number])  0  Comm Ins  [place]  01/26/2017  1  12/17/2016  Vyvanse 50 Mg Capsule  30 30 Je Cop  [id number]  [place] ([id number])  0      Need to do UDS   Patient Active Problem List   Diagnosis Date Noted  . Thyroid activity decreased 04/20/2014  . BMI 40.0-44.9, adult (HCC) 09/19/2012  . Hypertension 05/16/2011  . Attention deficit hyperactivity disorder (ADHD) 05/16/2011    Past Medical History:  Diagnosis Date  . ADD (attention deficit disorder with hyperactivity)   . Allergy   . Anemia   . Asthma   . Hypertension   . Thyroid disease     Past Surgical History:  Procedure Laterality Date  . TONSILLECTOMY AND ADENOIDECTOMY      Social History   Tobacco Use  . Smoking status: Former Smoker    Last attempt to quit: 03/02/2008    Years since quitting: 9.5  Substance Use Topics  . Alcohol use: Yes   Alcohol/week: 3.0 oz    Types: 5 drink(s) per week  . Drug use: No    Family History  Problem Relation Age of Onset  . Prostate cancer Father     Allergies  Allergen Reactions  . Amoxicillin Hives    Medication list has been reviewed and updated.  Current Outpatient Medications on File Prior to Visit  Medication Sig Dispense Refill  . bisoprolol-hydrochlorothiazide (ZIAC) 5-6.25 MG tablet TAKE 1 TABLET BY MOUTH EVERY DAY 30 tablet 0  . fluticasone (FLONASE) 50 MCG/ACT nasal spray Place 2 sprays into both nostrils daily. 16 g 0  . levothyroxine (SYNTHROID, LEVOTHROID) 50 MCG tablet Take 1 tablet (50 mcg total) by mouth daily before breakfast. 90 tablet 2  . lisdexamfetamine (VYVANSE) 50 MG capsule Take 1 capsule (50 mg total) by mouth every morning. Ok to fill in 60 days 30 capsule 0  . lisdexamfetamine (VYVANSE) 50 MG capsule Take 1 capsule (50 mg total) by mouth every morning. Ok to fill in 30 days 30 capsule 0  . lisdexamfetamine (VYVANSE)  50 MG capsule Take 1 capsule (50 mg total) by mouth daily. 30 capsule 0   No current facility-administered medications on file prior to visit.     Review of Systems:  As per HPI- otherwise negative.   Physical Examination: There were no vitals filed for this visit. There were no vitals filed for this visit. There is no height or weight on file to calculate BMI. Ideal Body Weight:    GEN: WDWN, NAD, Non-toxic, A & O x 3 HEENT: Atraumatic, Normocephalic. Neck supple. No masses, No LAD. Ears and Nose: No external deformity. CV: RRR, No M/G/R. No JVD. No thrill. No extra heart sounds. PULM: CTA B, no wheezes, crackles, rhonchi. No retractions. No resp. distress. No accessory muscle use. ABD: S, NT, ND, +BS. No rebound. No HSM. EXTR: No c/c/e NEURO Normal gait.  PSYCH: Normally interactive. Conversant. Not depressed or anxious appearing.  Calm demeanor.    Assessment and Plan: ***  Signed Abbe Amsterdam, MD

## 2017-09-25 ENCOUNTER — Encounter: Payer: Self-pay | Admitting: Family Medicine

## 2017-09-26 ENCOUNTER — Encounter: Payer: BLUE CROSS/BLUE SHIELD | Admitting: Family Medicine

## 2017-10-02 ENCOUNTER — Other Ambulatory Visit: Payer: Self-pay | Admitting: Family Medicine

## 2017-10-02 DIAGNOSIS — E038 Other specified hypothyroidism: Secondary | ICD-10-CM

## 2017-10-24 ENCOUNTER — Encounter: Payer: Self-pay | Admitting: Family Medicine

## 2017-11-03 NOTE — Progress Notes (Addendum)
[facility] [address], [address] [place], [place] [address] [phone]  Date:  11/06/2017   Name:  Emma Rojas   DOB:  December 31, 1980   MRN:  [id number]  PCP:  Pearline Cables, MD    Chief Complaint: Annual Exam (no concerns)   History of Present Illness:  Emma Rojas is a 37 y.o. very pleasant female patient who presents with the following:  Here today for a CPE History of obesity, hypertension, ADD, hypothyroidism She takes vyvance 50mg  daily for her ADD Last seen here in October  Over the last month her only meat intake has been fish She recently moved to [place] She is working for [employer] and her territory is [place]  Pap: 11/18 Labs: due now Immun: tetanus is due UDS:  Due  She is on OCP now and her menorrhagia is much better  Lab Results  Component Value Date   TSH 3.36 01/21/2017   NCCSR:  09/11/2017  1  09/11/2017  Vyvanse 50 Mg Capsule  30 30 Je Cop  [id number]  [place] ([id number])  1/1  Comm Ins  [place]  05/13/2017  1  05/13/2017  Vyvanse 50 Mg Capsule  30 30 Je Cop  [id number]  [place] ([id number])  1/1  Private Pay  [place]  03/29/2017  1  03/29/2017  Vyvanse 50 Mg Capsule  30 30 Je Cop  [id number]  [place] ([id number])  1/1  Comm Ins  [place]  02/25/2017  1  12/17/2016  Vyvanse 50 Mg Capsule  30 30 Je Cop  [id number]  [place] ([id number])  1/1  Comm Ins  [place]  01/26/2017  1  12/17/2016  Vyvanse 50 Mg Capsule  30 30 Je Cop  [id number]  [place] ([id number])  1/1  Comm Ins  [place]  12/21/2016  1  12/17/2016  Vyvanse 50 Mg Capsule  30 30 Je Cop  [id number]  [place] ([id number])  1/1  Comm Ins  [place]  11/19/2016  1  11/19/2016  Vyvanse 60 Mg Capsule  30 30 Je Cop  [id number]  Nor ([id number])  1/1      vyvanse is working well for her   Patient Active Problem List   Diagnosis Date Noted  . Thyroid activity decreased 04/20/2014  . BMI 40.0-44.9, adult (HCC) 09/19/2012  . Hypertension 05/16/2011  . Attention deficit hyperactivity disorder (ADHD) 05/16/2011    Past Medical History:   Diagnosis Date  . ADD (attention deficit disorder with hyperactivity)   . Allergy   . Anemia   . Asthma   . Hypertension   . Thyroid disease     Past Surgical History:  Procedure Laterality Date  . TONSILLECTOMY AND ADENOIDECTOMY      Social History   Tobacco Use  . Smoking status: Former Smoker    Last attempt to quit: 03/02/2008    Years since quitting: 9.6  . Smokeless tobacco: Never Used  Substance Use Topics  . Alcohol use: Yes    Alcohol/week: 3.0 oz    Types: 5 Standard drinks or equivalent per week  . Drug use: No    Family History  Problem Relation Age of Onset  . Prostate cancer Father     Allergies  Allergen Reactions  . Amoxicillin Hives  . Penicillins Hives    Medication list has been reviewed and updated.  Current Outpatient Medications on File Prior to Visit  Medication Sig Dispense Refill  . bisoprolol-hydrochlorothiazide [facility]) 5-6.25  MG tablet TAKE 1 TABLET BY MOUTH EVERY DAY 30 tablet 0  . levothyroxine (SYNTHROID, LEVOTHROID) 50 MCG tablet TAKE 1 TABLET (50 MCG TOTAL) BY MOUTH DAILY BEFORE BREAKFAST. 30 tablet 0  . lisdexamfetamine (VYVANSE) 50 MG capsule Take 1 capsule (50 mg total) by mouth every morning. Ok to fill in 60 days 30 capsule 0  . lisdexamfetamine (VYVANSE) 50 MG capsule Take 1 capsule (50 mg total) by mouth every morning. Ok to fill in 30 days 30 capsule 0  . lisdexamfetamine (VYVANSE) 50 MG capsule Take 1 capsule (50 mg total) by mouth daily. 30 capsule 0  . norethindrone (MICRONOR,CAMILA,ERRIN) 0.35 MG tablet Take 1 tablet by mouth daily.    . fluticasone (FLONASE) 50 MCG/ACT nasal spray Place 2 sprays into both nostrils daily. 16 g 0   No current facility-administered medications on file prior to visit.     Review of Systems:  As per HPI- otherwise negative. No fever or chills No CP or SOB   Physical Examination: Vitals:   11/06/17 1320  BP: 126/88  Pulse: 87  Resp: 16  SpO2: 98%   Vitals:   11/06/17 1320   Weight: 277 lb (125.6 kg)  Height: 5' 3.5" (1.613 m)   Body mass index is 48.3 kg/m. Ideal Body Weight: Weight in (lb) to have BMI = 25: 143.1  GEN: WDWN, NAD, Non-toxic, A & O x 3, obese, otherwise looks well  HEENT: Atraumatic, Normocephalic. Neck supple. No masses, No LAD.  Bilateral TM wnl, oropharynx normal.  PEERL,EOMI.   Ears and Nose: No external deformity. CV: RRR, No M/G/R. No JVD. No thrill. No extra heart sounds. PULM: CTA B, no wheezes, crackles, rhonchi. No retractions. No resp. distress. No accessory muscle use. ABD: S, NT, ND. No rebound. No HSM. EXTR: No c/c/e NEURO Normal gait.  PSYCH: Normally interactive. Conversant. Not depressed or anxious appearing.  Calm demeanor.    Assessment and Plan: Physical exam  Other specified hypothyroidism - Plan: TSH  Screening for deficiency anemia - Plan: CBC  Screening for diabetes mellitus - Plan: Comprehensive metabolic panel, Hemoglobin A1c  Screening for hyperlipidemia - Plan: Lipid panel  Attention deficit disorder, unspecified hyperactivity presence - Plan: Pain Mgmt, Profile 8 w/Conf, U, lisdexamfetamine (VYVANSE) 50 MG capsule, lisdexamfetamine (VYVANSE) 50 MG capsule, lisdexamfetamine (VYVANSE) 50 MG capsule  Essential hypertension - Plan: bisoprolol-hydrochlorothiazide (ZIAC) 5-6.25 MG tablet  Immunization due - Plan: Tdap vaccine greater than or equal to 7yo IM  CPE today Updated tetanus Labs pending as above UDS today Refilled vyvanse BP under control She is really working on her weight - continued to encourage her   Signed Abbe Amsterdam, MD\  Received her labs 8/3  Results for orders placed or performed in visit on 11/06/17  CBC  Result Value Ref Range   WBC 11.2 (H) 4.0 - 10.5 K/uL   RBC 3.88 3.87 - 5.11 Mil/uL   Platelets 317.0 150.0 - 400.0 K/uL   Hemoglobin 12.5 12.0 - 15.0 g/dL   HCT 78.2 95.6 - 21.3 %   MCV 95.3 78.0 - 100.0 fl   MCHC 33.8 30.0 - 36.0 g/dL   RDW 08.6 57.8 - 46.9 %   Comprehensive metabolic panel  Result Value Ref Range   Sodium 136 135 - 145 mEq/L   Potassium 4.2 3.5 - 5.1 mEq/L   Chloride 101 96 - 112 mEq/L   CO2 27 19 - 32 mEq/L   Glucose, Bld 87 70 - 99 mg/dL   BUN 8  6 - 23 mg/dL   Creatinine, Ser 1.610.69 0.40 - 1.20 mg/dL   Total Bilirubin 0.9 0.2 - 1.2 mg/dL   Alkaline Phosphatase 59 39 - 117 U/L   AST 42 (H) 0 - 37 U/L   ALT 42 (H) 0 - 35 U/L   Total Protein 7.1 6.0 - 8.3 g/dL   Albumin 4.0 3.5 - 5.2 g/dL   Calcium 9.2 8.4 - 09.610.5 mg/dL   GFR 045.40101.37 >98.11>60.00 mL/min  Hemoglobin A1c  Result Value Ref Range   Hgb A1c MFr Bld 5.5 4.6 - 6.5 %  Lipid panel  Result Value Ref Range   Cholesterol 182 0 - 200 mg/dL   Triglycerides 914.7206.0 (H) 0.0 - 149.0 mg/dL   HDL 82.9548.50 >62.13>39.00 mg/dL   VLDL 08.641.2 (H) 0.0 - 57.840.0 mg/dL   Total CHOL/HDL Ratio 4    NonHDL 133.21   Pain Mgmt, Profile 8 w/Conf, U  Result Value Ref Range   Creatinine 113.9 > or = 20. mg/dL   pH 4.696.30 4.5 - 9.0   Oxidant NEGATIVE <200 mcg/mL   Amphetamines NEGATIVE <500 ng/mL   medMATCH Amphetamines CONSISTENT    Benzodiazepines NEGATIVE <100 ng/mL   medMATCH Benzodiazepines CONSISTENT    Marijuana Metabolite NEGATIVE <20 ng/mL   medMATCH Marijuana Metab CONSISTENT    Cocaine Metabolite NEGATIVE <150 ng/mL   medMATCH Cocaine Metab CONSISTENT    Opiates NEGATIVE <100 ng/mL   medMATCH Opiates CONSISTENT    Oxycodone NEGATIVE <100 ng/mL   medMATCH Oxycodone CONSISTENT    Buprenorphine, Urine NEGATIVE <5 ng/mL   medMATCH Buprenorphine CONSISTENT    MDMA NEGATIVE <500 ng/mL   [facility] MDMA CONSISTENT    Alcohol Metabolites NEGATIVE <500 ng/mL   medMATCH Alcohol Metab CONSISTENT    6 Acetylmorphine NEGATIVE <10 ng/mL   medMATCH 6 Acetylmorphine CONSISTENT   TSH  Result Value Ref Range   TSH 2.73 0.35 - 4.50 uIU/mL  LDL cholesterol, direct  Result Value Ref Range   Direct LDL 111.0 mg/dL   Message to pt

## 2017-11-04 ENCOUNTER — Other Ambulatory Visit: Payer: Self-pay | Admitting: Family Medicine

## 2017-11-04 DIAGNOSIS — E038 Other specified hypothyroidism: Secondary | ICD-10-CM

## 2017-11-06 ENCOUNTER — Encounter: Payer: Self-pay | Admitting: Family Medicine

## 2017-11-06 ENCOUNTER — Ambulatory Visit (INDEPENDENT_AMBULATORY_CARE_PROVIDER_SITE_OTHER): Payer: 59 | Admitting: Family Medicine

## 2017-11-06 VITALS — BP 126/88 | HR 87 | Resp 16 | Ht 63.5 in | Wt 277.0 lb

## 2017-11-06 DIAGNOSIS — I10 Essential (primary) hypertension: Secondary | ICD-10-CM

## 2017-11-06 DIAGNOSIS — Z130 Encounter for screening for diseases of the blood and blood-forming organs and certain disorders involving the immune mechanism: Secondary | ICD-10-CM | POA: Diagnosis not present

## 2017-11-06 DIAGNOSIS — Z13220 Encounter for screening for lipoid disorders: Secondary | ICD-10-CM

## 2017-11-06 DIAGNOSIS — Z0000 Encounter for general adult medical examination without abnormal findings: Secondary | ICD-10-CM | POA: Diagnosis not present

## 2017-11-06 DIAGNOSIS — Z23 Encounter for immunization: Secondary | ICD-10-CM | POA: Diagnosis not present

## 2017-11-06 DIAGNOSIS — Z131 Encounter for screening for diabetes mellitus: Secondary | ICD-10-CM | POA: Diagnosis not present

## 2017-11-06 DIAGNOSIS — E038 Other specified hypothyroidism: Secondary | ICD-10-CM | POA: Diagnosis not present

## 2017-11-06 DIAGNOSIS — F988 Other specified behavioral and emotional disorders with onset usually occurring in childhood and adolescence: Secondary | ICD-10-CM

## 2017-11-06 MED ORDER — BISOPROLOL-HYDROCHLOROTHIAZIDE 5-6.25 MG PO TABS: 1.0000 | ORAL_TABLET | Freq: Every day | ORAL | 3 refills | Status: DC

## 2017-11-06 MED ORDER — LISDEXAMFETAMINE DIMESYLATE 50 MG PO CAPS: 50.0000 mg | ORAL_CAPSULE | Freq: Every day | ORAL | 0 refills | Status: DC

## 2017-11-06 MED ORDER — LISDEXAMFETAMINE DIMESYLATE 50 MG PO CAPS: 50.0000 mg | ORAL_CAPSULE | ORAL | 0 refills | Status: DC

## 2017-11-06 NOTE — Patient Instructions (Signed)
A pleasure to see you as always!  Best of luck with your new job and home! I will be in touch with your labs asap You got your tetanus vaccine today- good for 10 years  Your BP looks ok  Your weight is about the same - not up, which is good. Do try to get back into more regular exercise and continue to work on diet  Let's visit in 6 months [facility]

## 2017-11-07 LAB — COMPREHENSIVE METABOLIC PANEL
ALT: 42 U/L — ABNORMAL HIGH (ref 0–35)
AST: 42 U/L — AB (ref 0–37)
Albumin: 4 g/dL (ref 3.5–5.2)
Alkaline Phosphatase: 59 U/L (ref 39–117)
BUN: 8 mg/dL (ref 6–23)
CHLORIDE: 101 meq/L (ref 96–112)
CO2: 27 mEq/L (ref 19–32)
CREATININE: 0.69 mg/dL (ref 0.40–1.20)
Calcium: 9.2 mg/dL (ref 8.4–10.5)
GFR: 101.37 mL/min (ref >60.00)
Glucose, Bld: 87 mg/dL (ref 70–99)
POTASSIUM: 4.2 meq/L (ref 3.5–5.1)
Sodium: 136 mEq/L (ref 135–145)
Total Bilirubin: 0.9 mg/dL (ref 0.2–1.2)
Total Protein: 7.1 g/dL (ref 6.0–8.3)

## 2017-11-07 LAB — PAIN MGMT, PROFILE 8 W/CONF, U
6 Acetylmorphine: NEGATIVE ng/mL (ref <10)
AMPHETAMINES: NEGATIVE ng/mL (ref <500)
Alcohol Metabolites: NEGATIVE ng/mL (ref <500)
Benzodiazepines: NEGATIVE ng/mL (ref <100)
Buprenorphine, Urine: NEGATIVE ng/mL (ref <5)
Cocaine Metabolite: NEGATIVE ng/mL (ref <150)
Creatinine: 113.9 mg/dL
MDMA: NEGATIVE ng/mL (ref <500)
Marijuana Metabolite: NEGATIVE ng/mL (ref <20)
Opiates: NEGATIVE ng/mL (ref <100)
Oxidant: NEGATIVE ug/mL (ref <200)
Oxycodone: NEGATIVE ng/mL (ref <100)
pH: 6.3 (ref 4.5–9.0)

## 2017-11-07 LAB — CBC
HCT: 37 % (ref 36.0–46.0)
Hemoglobin: 12.5 g/dL (ref 12.0–15.0)
MCHC: 33.8 g/dL (ref 30.0–36.0)
MCV: 95.3 fl (ref 78.0–100.0)
Platelets: 317 10*3/uL (ref 150.0–400.0)
RBC: 3.88 Mil/uL (ref 3.87–5.11)
RDW: 13.6 % (ref 11.5–15.5)
WBC: 11.2 10*3/uL — ABNORMAL HIGH (ref 4.0–10.5)

## 2017-11-07 LAB — LIPID PANEL
CHOLESTEROL: 182 mg/dL (ref 0–200)
HDL: 48.5 mg/dL (ref >39.00)
NonHDL: 133.21
Total CHOL/HDL Ratio: 4
Triglycerides: 206 mg/dL — ABNORMAL HIGH (ref 0.0–149.0)
VLDL: 41.2 mg/dL — ABNORMAL HIGH (ref 0.0–40.0)

## 2017-11-07 LAB — TSH: TSH: 2.73 u[IU]/mL (ref 0.35–4.50)

## 2017-11-07 LAB — HEMOGLOBIN A1C: Hgb A1c MFr Bld: 5.5 % (ref 4.6–6.5)

## 2017-11-07 LAB — LDL CHOLESTEROL, DIRECT: LDL DIRECT: 111 mg/dL

## 2017-11-09 ENCOUNTER — Encounter: Payer: Self-pay | Admitting: Family Medicine

## 2017-11-27 ENCOUNTER — Encounter: Payer: Self-pay | Admitting: Family Medicine

## 2017-11-27 DIAGNOSIS — R300 Dysuria: Secondary | ICD-10-CM

## 2017-11-27 MED ORDER — NITROFURANTOIN MONOHYD MACRO 100 MG PO CAPS: 100.0000 mg | ORAL_CAPSULE | Freq: Two times a day (BID) | ORAL | 0 refills | Status: DC

## 2017-12-07 ENCOUNTER — Other Ambulatory Visit: Payer: Self-pay | Admitting: Family Medicine

## 2017-12-07 DIAGNOSIS — E038 Other specified hypothyroidism: Secondary | ICD-10-CM

## 2018-01-13 DIAGNOSIS — K5900 Constipation, unspecified: Secondary | ICD-10-CM | POA: Diagnosis not present

## 2018-01-13 DIAGNOSIS — R109 Unspecified abdominal pain: Secondary | ICD-10-CM | POA: Diagnosis not present

## 2018-03-31 ENCOUNTER — Encounter: Payer: Self-pay | Admitting: Family Medicine

## 2018-03-31 DIAGNOSIS — F988 Other specified behavioral and emotional disorders with onset usually occurring in childhood and adolescence: Secondary | ICD-10-CM

## 2018-03-31 MED ORDER — LISDEXAMFETAMINE DIMESYLATE 50 MG PO CAPS: 50.0000 mg | ORAL_CAPSULE | Freq: Every day | ORAL | 0 refills | Status: DC

## 2018-03-31 MED ORDER — LISDEXAMFETAMINE DIMESYLATE 50 MG PO CAPS: 50.0000 mg | ORAL_CAPSULE | ORAL | 0 refills | Status: DC

## 2018-03-31 NOTE — Telephone Encounter (Signed)
Last seen in July  03/05/2018  1   11/06/2017  Vyvanse 50 Mg Capsule  30.00 30 Je Cop  [id number]  [place] ([id number])  0/0  Comm Ins  [place]  12/11/2017  1   11/06/2017  Vyvanse 50 Mg Capsule  30.00 30 Je Cop  [id number]  [place] ([id number])  0/0  Comm Ins  [place]  11/10/2017  1   11/06/2017  Vyvanse 50 Mg Capsule  30.00 30 Je Cop  [id number]  [place] ([id number])  0/0  Comm Ins  [place]  09/11/2017  1   09/11/2017  Vyvanse 50 Mg Capsule  30.00 30 Je Cop  [id number]  [place] ([id number])  0/0  Comm Ins  [place]  05/13/2017  1   05/13/2017  Vyvanse 50 Mg Capsule  30.00 30 Je Cop  [id number]  [place] ([id number])  0/0  Private Pay    03/29/2017  1   03/29/2017  Vyvanse 50 Mg Capsule  30.00 30 Je Cop  [id number]  Nor ([id number])  0/0      Refilled, asked her to see me in the next couple of months

## 2018-05-05 ENCOUNTER — Other Ambulatory Visit: Payer: Self-pay | Admitting: Family Medicine

## 2018-05-05 DIAGNOSIS — E038 Other specified hypothyroidism: Secondary | ICD-10-CM

## 2018-06-05 ENCOUNTER — Encounter: Payer: Self-pay | Admitting: Family Medicine

## 2018-06-05 DIAGNOSIS — F988 Other specified behavioral and emotional disorders with onset usually occurring in childhood and adolescence: Secondary | ICD-10-CM

## 2018-06-05 MED ORDER — LISDEXAMFETAMINE DIMESYLATE 50 MG PO CAPS: 50.0000 mg | ORAL_CAPSULE | ORAL | 0 refills | Status: DC

## 2018-06-05 NOTE — Telephone Encounter (Signed)
Last seen in the office in July Due for refill of Vyvanse Will remind her that we need an office visit-she is often out of town for work 05/07/2018  2   03/31/2018  Vyvanse 50 MG Capsule  30.00 30 Je Cop   [id number]   Nor ([id number])   0   Comm Ins   [place]  04/01/2018  2   03/31/2018  Vyvanse 50 MG Capsule  30.00 30 Je Cop   [id number]   [place] ([id number])   0   Comm Ins   [place]  03/05/2018  2   11/06/2017  Vyvanse 50 MG Capsule  30.00 30 Je Cop   [id number]   [place] ([id number])   0   Comm Ins   [place]  12/11/2017  2   11/06/2017  Vyvanse 50 MG Capsule  30.00 30 Je Cop   [id number]   [place] ([id number])   0   Comm Ins   [place]  11/10/2017  2   11/06/2017  Vyvanse 50 MG Capsule  30.00 30 Je Cop   [id number]   [place] ([id number])   0   Comm Ins   [place]  09/11/2017  2   09/11/2017  Vyvanse 50 MG Capsule  30.00 30 Je Cop   [id number]   Nor ([id number])   0   Comm Ins   [place]  06/15/2017  1   06/13/2017  Vyvanse 50 MG Capsule  30.00 30 Je Cop   [id number]   [place] ([id number])   0   Comm Ins   [place]  05/13/2017  2   05/13/2017  Vyvanse 50 MG Capsule  30.00 30 Je Cop   [id number]   Nor ([id number])   0   Private Pay   [place]  03/29/2017  2   03/29/2017  Vyvanse 50 MG Capsule  30.00 30 Je Cop   [id number]   Nor ([id number])   0   Comm Ins   [place]  02/25/2017  2   12/17/2016  Vyvanse 50 MG Capsule  30.00 30 Je Cop   [id number]   [place] ([id number])   0   Comm Ins   [place]  01/26/2017  2   12/17/2016  Vyvanse 50 MG Capsule  30.00 30 Je Cop   [id number]   [place] ([id number])   0   Comm Ins

## 2018-07-04 ENCOUNTER — Encounter: Payer: Self-pay | Admitting: Family Medicine

## 2018-07-17 ENCOUNTER — Encounter: Payer: Self-pay | Admitting: Family Medicine

## 2018-07-17 DIAGNOSIS — F988 Other specified behavioral and emotional disorders with onset usually occurring in childhood and adolescence: Secondary | ICD-10-CM

## 2018-07-17 MED ORDER — LISDEXAMFETAMINE DIMESYLATE 50 MG PO CAPS: 50.0000 mg | ORAL_CAPSULE | ORAL | 0 refills | Status: DC

## 2018-07-17 NOTE — Telephone Encounter (Signed)
I have pended medication request and send patient mychart message asking if I could set up webex appointment. Please advise on refill

## 2018-07-21 ENCOUNTER — Ambulatory Visit (INDEPENDENT_AMBULATORY_CARE_PROVIDER_SITE_OTHER): Payer: 59 | Admitting: Family Medicine

## 2018-07-21 ENCOUNTER — Encounter: Payer: Self-pay | Admitting: Family Medicine

## 2018-07-21 ENCOUNTER — Other Ambulatory Visit: Payer: Self-pay

## 2018-07-21 DIAGNOSIS — F988 Other specified behavioral and emotional disorders with onset usually occurring in childhood and adolescence: Secondary | ICD-10-CM | POA: Diagnosis not present

## 2018-07-21 DIAGNOSIS — H9201 Otalgia, right ear: Secondary | ICD-10-CM | POA: Diagnosis not present

## 2018-07-21 MED ORDER — OFLOXACIN 0.3 % OT SOLN: 10.0000 [drp] | Freq: Every day | OTIC | 0 refills | Status: AC

## 2018-07-21 MED ORDER — LISDEXAMFETAMINE DIMESYLATE 50 MG PO CAPS: 50.0000 mg | ORAL_CAPSULE | ORAL | 0 refills | Status: DC

## 2018-07-21 MED ORDER — AZITHROMYCIN 250 MG PO TABS: ORAL_TABLET | ORAL | 0 refills | Status: DC

## 2018-07-21 NOTE — Progress Notes (Signed)
[facility] at [facility] [address] [place], [place] [address] [phone]  Date:  07/21/2018   Name:  Emma Rojas   DOB:  [birth year]   MRN:  [id number]  PCP:  Pearline Cables, MD    Chief Complaint: No chief complaint on file.   History of Present Illness:  Emma Rojas is a 38 y.o. very pleasant female patient who presents with the following:  Virtual visit today due to COVID 19 outbreak Pt ID confirmed with name and DOB.  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  Her mom works for ENT and they have discussed it.  She has had both otitis media and externa in the past  She is working in the yard a lot  Swallowing can hurt some  Hearing is a bit muffled She feels like there is some matter coming out of the ear No bleeding or suggestion of TM rupture   07/19/2018  2   07/17/2018  Vyvanse 50 MG Capsule  30.00 30 Je Cop   [id number]   Nor ([id number])   0   Comm Ins   [place]  06/15/2018  2   06/05/2018  Vyvanse 50 MG Capsule  30.00 30 Je Cop   [id number]   [place] ([id number])   0   Comm Ins   [place]  05/07/2018  2   03/31/2018  Vyvanse 50 MG Capsule  30.00 30 Je Cop   [id number]   Nor ([id number])   0   Comm Ins   [place]  04/01/2018  2   03/31/2018  Vyvanse 50 MG Capsule  30.00 30 Je Cop   [id number]   Nor ([id number])   0   Comm Ins   [place]  03/05/2018  2   11/06/2017  Vyvanse 50 MG Capsule  30.00 30 Je Cop   [id number]   [place] ([id number])   0   Comm Ins   [place]  12/11/2017  2   11/06/2017  Vyvanse 50 MG Capsule  30.00 30 Je Cop   [id number]   [place] ([id number])   0   Comm Ins   [place]  11/10/2017  2   11/06/2017  Vyvanse 50 MG Capsule  30.00 30 Je Cop   [id number]   Nor ([id number])   0   Comm  Ins   [place]  09/11/2017  2   09/11/2017  Vyvanse 50 MG Capsule  30.00 30 Je Cop   [id number]   Nor ([id number])   0         Patient Active Problem List   Diagnosis Date Noted  . Thyroid activity decreased 04/20/2014  . BMI 40.0-44.9, adult (HCC) 09/19/2012  . Hypertension 05/16/2011  . Attention deficit hyperactivity disorder (ADHD) 05/16/2011    Past Medical History:  Diagnosis Date  . ADD (attention deficit disorder with hyperactivity)   . Allergy   . Anemia   . Asthma   . Hypertension   . Thyroid disease     Past Surgical History:  Procedure Laterality Date  . TONSILLECTOMY AND ADENOIDECTOMY  Social History   Tobacco Use  . Smoking status: Former Smoker    Last attempt to quit: 03/02/2008    Years since quitting: 10.3  . Smokeless tobacco: Never Used  Substance Use Topics  . Alcohol use: Yes    Alcohol/week: 5.0 standard drinks    Types: 5 Standard drinks or equivalent per week  . Drug use: No    Family History  Problem Relation Age of Onset  . Prostate cancer Father     Allergies  Allergen Reactions  . Amoxicillin Hives  . Penicillins Hives    Medication list has been reviewed and updated.  Current Outpatient Medications on File Prior to Visit  Medication Sig Dispense Refill  . bisoprolol-hydrochlorothiazide (ZIAC) 5-6.25 MG tablet Take 1 tablet by mouth daily. 90 tablet 3  . fluticasone (FLONASE) 50 MCG/ACT nasal spray Place 2 sprays into both nostrils daily. 16 g 0  . levothyroxine (SYNTHROID, LEVOTHROID) 50 MCG tablet TAKE 1 TABLET BY MOUTH EVERY DAY BEFORE BREAKFAST 90 tablet 1  . lisdexamfetamine (VYVANSE) 50 MG capsule Take 1 capsule (50 mg total) by mouth daily. 30 capsule 0  . lisdexamfetamine (VYVANSE) 50 MG capsule Take 1 capsule (50 mg total) by mouth every morning. 30 capsule 0  . lisdexamfetamine (VYVANSE) 50 MG capsule Take 1 capsule (50 mg total) by mouth every morning. 30 capsule 0  . nitrofurantoin, macrocrystal-monohydrate, (MACROBID) 100  MG capsule Take 1 capsule (100 mg total) by mouth 2 (two) times daily. 14 capsule 0  . norethindrone (MICRONOR,CAMILA,ERRIN) 0.35 MG tablet Take 1 tablet by mouth daily.     No current facility-administered medications on file prior to visit.     Review of Systems:  Besides her ear she has felt well No fever or chills  Physical Examination: There were no vitals filed for this visit. There were no vitals filed for this visit. There is no height or weight on file to calculate BMI. Ideal Body Weight:    Pt observed over web cam. She appears well, her normal self.  No cough, wheeze, or tachypnea observed  She indicates pain in her right ear   Assessment and Plan: Right ear pain - Plan: ofloxacin (FLOXIN) 0.3 % OTIC solution, azithromycin (ZITHROMAX) 250 MG tablet  Attention deficit disorder, unspecified hyperactivity presence - Plan: lisdexamfetamine (VYVANSE) 50 MG capsule, lisdexamfetamine (VYVANSE) 50 MG capsule  Following up on medication today She is doing well on current dose of vyvanse.  rx for 30 and 60 days given today  She has right ear pain and history  of both OM and OE  Since we are not able to do an exam will treat for both Azithromycin rx by mouth floxin otic drops Asked her to please let me know if ear is not improving in the next few days- Sooner if worse.  Went over signs of TM rupture   Patient discussion is summarized below-------------- It was great to talk with you today, hopefully your ear feels better very soon We are going to use azithromycin pills and also ofloxacin optic drops.  This will allow us to cover both a middle and external ear infection.  Please let me know if your ear is not improved in the next couple of days, sooner if getting worse As we discussed, sometimes a middle ear infection can cause the eardrum to rupture.  If this happens, you will probably notice an increase in pain followed by relief of pain but hearing loss.  Also, blood or pus  may drain from the ear If your eardrum ruptures we will want you to see ear nose and throat relatively soon.  Also, in that case please keep water out of your ear-Wear earplugs when showering  We will continue your current dose of Vyvanse  Please come and see me in the office over the summer, perhaps in July.  Your annual drug screen is due in July  Take care, stay safe  Signed Abbe Amsterdam, MD

## 2018-07-21 NOTE — Patient Instructions (Addendum)
It was great to talk with you today, hopefully your ear feels better very soon We are going to use azithromycin pills and also ofloxacin optic drops.  This will allow [place] to cover both a middle and external ear infection.  Please let me know if your ear is not improved in the next couple of days, sooner if getting worse As we discussed, sometimes a middle ear infection can cause the eardrum to rupture.  If this happens, you will probably notice an increase in pain followed by relief of pain but hearing loss.  Also, blood or pus may drain from the ear If your eardrum ruptures we will want you to see ear nose and throat relatively soon.  Also, in that case please keep water out of your ear-Wear earplugs when showering  We will continue your current dose of Vyvanse  Please come and see me in the office over the summer, perhaps in July.  Your annual drug screen is due in July  Take care, stay safe

## 2018-08-18 ENCOUNTER — Encounter: Payer: Self-pay | Admitting: Family Medicine

## 2018-08-18 DIAGNOSIS — H6121 Impacted cerumen, right ear: Secondary | ICD-10-CM | POA: Diagnosis not present

## 2018-08-18 DIAGNOSIS — H60331 Swimmer's ear, right ear: Secondary | ICD-10-CM | POA: Diagnosis not present

## 2018-08-18 DIAGNOSIS — H6690 Otitis media, unspecified, unspecified ear: Secondary | ICD-10-CM | POA: Diagnosis not present

## 2018-09-03 DIAGNOSIS — H7291 Unspecified perforation of tympanic membrane, right ear: Secondary | ICD-10-CM | POA: Diagnosis not present

## 2018-09-03 DIAGNOSIS — H9211 Otorrhea, right ear: Secondary | ICD-10-CM | POA: Diagnosis not present

## 2018-09-03 DIAGNOSIS — H6121 Impacted cerumen, right ear: Secondary | ICD-10-CM | POA: Diagnosis not present

## 2018-10-20 ENCOUNTER — Encounter: Payer: Self-pay | Admitting: Family Medicine

## 2018-10-20 DIAGNOSIS — F988 Other specified behavioral and emotional disorders with onset usually occurring in childhood and adolescence: Secondary | ICD-10-CM

## 2018-10-20 MED ORDER — LISDEXAMFETAMINE DIMESYLATE 50 MG PO CAPS: 50.0000 mg | ORAL_CAPSULE | Freq: Every day | ORAL | 0 refills | Status: DC

## 2018-10-21 ENCOUNTER — Other Ambulatory Visit: Payer: Self-pay | Admitting: Family Medicine

## 2018-10-21 DIAGNOSIS — I10 Essential (primary) hypertension: Secondary | ICD-10-CM

## 2018-10-21 DIAGNOSIS — E038 Other specified hypothyroidism: Secondary | ICD-10-CM

## 2018-11-17 ENCOUNTER — Other Ambulatory Visit: Payer: Self-pay | Admitting: Family Medicine

## 2018-11-17 DIAGNOSIS — I10 Essential (primary) hypertension: Secondary | ICD-10-CM

## 2018-11-17 DIAGNOSIS — E038 Other specified hypothyroidism: Secondary | ICD-10-CM

## 2018-11-25 ENCOUNTER — Encounter: Payer: Self-pay | Admitting: Family Medicine

## 2018-11-25 DIAGNOSIS — F988 Other specified behavioral and emotional disorders with onset usually occurring in childhood and adolescence: Secondary | ICD-10-CM

## 2018-11-25 MED ORDER — LISDEXAMFETAMINE DIMESYLATE 50 MG PO CAPS: 50.0000 mg | ORAL_CAPSULE | Freq: Every day | ORAL | 0 refills | Status: DC

## 2018-12-25 NOTE — Progress Notes (Signed)
appt canceled

## 2018-12-25 NOTE — Patient Instructions (Signed)
It was great to see you again today, I will be in touch with your labs ASAP You might consider having a baseline mammogram done now, or you can wait until age 39 to begin screening   Health Maintenance, Female Adopting a healthy lifestyle and getting preventive care are important in promoting health and wellness. Ask your health care provider about:  The right schedule for you to have regular tests and exams.  Things you can do on your own to prevent diseases and keep yourself healthy. What should I know about diet, weight, and exercise? Eat a healthy diet   Eat a diet that includes plenty of vegetables, fruits, low-fat dairy products, and lean protein.  Do not eat a lot of foods that are high in solid fats, added sugars, or sodium. Maintain a healthy weight Body mass index (BMI) is used to identify weight problems. It estimates body fat based on height and weight. Your health care provider can help determine your BMI and help you achieve or maintain a healthy weight. Get regular exercise Get regular exercise. This is one of the most important things you can do for your health. Most adults should:  Exercise for at least 150 minutes each week. The exercise should increase your heart rate and make you sweat (moderate-intensity exercise).  Do strengthening exercises at least twice a week. This is in addition to the moderate-intensity exercise.  Spend less time sitting. Even light physical activity can be beneficial. Watch cholesterol and blood lipids Have your blood tested for lipids and cholesterol at 39 years of age, then have this test every 5 years. Have your cholesterol levels checked more often if:  Your lipid or cholesterol levels are high.  You are older than 39 years of age.  You are at high risk for heart disease. What should I know about cancer screening? Depending on your health history and family history, you may need to have cancer screening at various ages. This may  include screening for:  Breast cancer.  Cervical cancer.  Colorectal cancer.  Skin cancer.  Lung cancer. What should I know about heart disease, diabetes, and high blood pressure? Blood pressure and heart disease  High blood pressure causes heart disease and increases the risk of stroke. This is more likely to develop in people who have high blood pressure readings, are of African descent, or are overweight.  Have your blood pressure checked: ? Every 3-5 years if you are 81-94 years of age. ? Every year if you are 8 years old or older. Diabetes Have regular diabetes screenings. This checks your fasting blood sugar level. Have the screening done:  Once every three years after age 39 if you are at a normal weight and have a low risk for diabetes.  More often and at a younger age if you are overweight or have a high risk for diabetes. What should I know about preventing infection? Hepatitis B If you have a higher risk for hepatitis B, you should be screened for this virus. Talk with your health care provider to find out if you are at risk for hepatitis B infection. Hepatitis C Testing is recommended for:  Everyone born from [birth year] through 1965.  Anyone with known risk factors for hepatitis C. Sexually transmitted infections (STIs)  Get screened for STIs, including gonorrhea and chlamydia, if: ? You are sexually active and are younger than 39 years of age. ? You are older than 39 years of age and your health care provider tells  you that you are at risk for this type of infection. ? Your sexual activity has changed since you were last screened, and you are at increased risk for chlamydia or gonorrhea. Ask your health care provider if you are at risk.  Ask your health care provider about whether you are at high risk for HIV. Your health care provider may recommend a prescription medicine to help prevent HIV infection. If you choose to take medicine to prevent HIV, you should first  get tested for HIV. You should then be tested every 3 months for as long as you are taking the medicine. Pregnancy  If you are about to stop having your period (premenopausal) and you may become pregnant, seek counseling before you get pregnant.  Take 400 to 800 micrograms (mcg) of folic acid every day if you become pregnant.  Ask for birth control (contraception) if you want to prevent pregnancy. Osteoporosis and menopause Osteoporosis is a disease in which the bones lose minerals and strength with aging. This can result in bone fractures. If you are 66 years old or older, or if you are at risk for osteoporosis and fractures, ask your health care provider if you should:  Be screened for bone loss.  Take a calcium or vitamin D supplement to lower your risk of fractures.  Be given hormone replacement therapy (HRT) to treat symptoms of menopause. Follow these instructions at home: Lifestyle  Do not use any products that contain nicotine or tobacco, such as cigarettes, e-cigarettes, and chewing tobacco. If you need help quitting, ask your health care provider.  Do not use street drugs.  Do not share needles.  Ask your health care provider for help if you need support or information about quitting drugs. Alcohol use  Do not drink alcohol if: ? Your health care provider tells you not to drink. ? You are pregnant, may be pregnant, or are planning to become pregnant.  If you drink alcohol: ? Limit how much you use to 0-1 drink a day. ? Limit intake if you are breastfeeding.  Be aware of how much alcohol is in your drink. In the [place] one drink equals one 12 oz bottle of beer (355 mL), one 5 oz glass of wine (148 mL), or one 1 oz glass of hard liquor (44 mL). General instructions  Schedule regular health, dental, and eye exams.  Stay current with your vaccines.  Tell your health care provider if: ? You often feel depressed. ? You have ever been abused or do not feel safe at  home. Summary  Adopting a healthy lifestyle and getting preventive care are important in promoting health and wellness.  Follow your health care provider's instructions about healthy diet, exercising, and getting tested or screened for diseases.  Follow your health care provider's instructions on monitoring your cholesterol and blood pressure. This information is not intended to replace advice given to you by your health care provider. Make sure you discuss any questions you have with your health care provider. Document Released: 10/09/2010 Document Revised: 03/19/2018 Document Reviewed: 03/19/2018 Elsevier Patient Education  2020 [employer].

## 2018-12-29 ENCOUNTER — Ambulatory Visit (INDEPENDENT_AMBULATORY_CARE_PROVIDER_SITE_OTHER): Payer: 59 | Admitting: Family Medicine

## 2018-12-29 DIAGNOSIS — Z13220 Encounter for screening for lipoid disorders: Secondary | ICD-10-CM

## 2018-12-29 DIAGNOSIS — E038 Other specified hypothyroidism: Secondary | ICD-10-CM

## 2018-12-29 DIAGNOSIS — Z0000 Encounter for general adult medical examination without abnormal findings: Secondary | ICD-10-CM

## 2018-12-29 DIAGNOSIS — I10 Essential (primary) hypertension: Secondary | ICD-10-CM

## 2018-12-29 DIAGNOSIS — Z130 Encounter for screening for diseases of the blood and blood-forming organs and certain disorders involving the immune mechanism: Secondary | ICD-10-CM

## 2018-12-29 DIAGNOSIS — Z131 Encounter for screening for diabetes mellitus: Secondary | ICD-10-CM

## 2018-12-29 DIAGNOSIS — F988 Other specified behavioral and emotional disorders with onset usually occurring in childhood and adolescence: Secondary | ICD-10-CM

## 2019-01-10 NOTE — Progress Notes (Addendum)
[facility] [address], [address] [place], [place] [address] [phone] [phone]  Date:  01/12/2019   Name:  Emma SoursSarah C Rojas   DOB:  01-14-1981   MRN:  [id number]  PCP:  Emma Rojas    Chief Complaint: Annual Exam   History of Present Illness:  Emma Rojas is a 38 y.o. very pleasant female patient who presents with the following:  Here today for complete physical-we had a virtual visit in April of this year for an ear infection. She ended up getting a ruptured TM and saw ENT for this  Otherwise doing well- History of obesity, hypertension, hypothyroidism, ADD  Currently taking Vyvanse 50 mg daily Also levothyroxine, Ziac, birth control pills  Flu vaccine-  Give today  Pap is up-to-date Mammogram?- no family history. She plans to do at age 38  Due for routine labs- she is not fasting but that is ok  She plans to try and eat healthier   She is spending a lot of time in [place] right now - During non-pandemic times she is generally in [place]  She is using vyvanse for ADD but has not taken in about a week. She actually feels better not using it right now She would like to perhaps go on something less long acting like adderall that she can use prn  She took addeall in the past and did ok with it  Admits that she has been going out to eat, drinking more alcohol than is typical for her.  She and her partner plan to try to adopt a healthier diet and lifestyle routine soon   Wt Readings from Last 3 Encounters:  01/12/19 287 lb (130.2 kg)  11/06/17 277 lb (125.6 kg)  01/21/17 276 lb 9.6 oz (125.5 kg)     Lab Results  Component Value Date   TSH 2.73 11/06/2017   11/28/2018  1   11/25/2018  Vyvanse 50 MG Capsule  30.00  30 Je Cop   [id number]   [place] ([id number])   0   Comm Ins   [place]  10/20/2018  1   10/20/2018  Vyvanse 50 MG Capsule  30.00  30 Je Cop   [id number]   [place] ([id number])   0   Comm Ins   [place]  09/19/2018  1    07/21/2018  Vyvanse 50 MG Capsule  30.00  30 Je Cop   [id number]   [place] ([id number])   0   Comm Ins   [place]  08/19/2018  1   07/21/2018  Vyvanse 50 MG Capsule  30.00  30 Je Cop   [id number]   [place] ([id number])   0   Comm Ins   [place]  07/19/2018  1   07/17/2018  Vyvanse 50 MG Capsule  30.00  30 Je Cop   [id number]   [place] ([id number])   0   Comm Ins   [place]  06/15/2018  1   06/05/2018  Vyvanse 50 MG Capsule  30.00  30 Je Cop   [id number]   [place] ([id number])   0   Comm Ins   [place]  05/07/2018  1   03/31/2018  Vyvanse 50 MG Capsule  30.00  30 Je Cop   [id number]   [place] ([id number])   0   Comm Ins   [place]  04/01/2018  1   03/31/2018  Vyvanse 50 MG Capsule  30.00  30 Je Cop   [id number]  [place] ([id number])   0   Comm Ins   [place]  03/05/2018  1   11/06/2017  Vyvanse 50 MG Capsule  30.00  30 Je Cop   [id number]   [place] ([id number])   0   Comm Ins   [place]  12/11/2017  1   11/06/2017  Vyvanse 50 MG Capsule  30.00  30 Je Cop   [id number]   [place] ([id number])   0   Comm Ins   [place]  11/10/2017  1   11/06/2017  Vyvanse 50 MG Capsule  30.00  30 Je Cop   [id number]   [place] ([id number])   0        Patient Active Problem List   Diagnosis Date Noted  . Thyroid activity decreased 04/20/2014  . BMI 40.0-44.9, adult (HCC) 09/19/2012  . Hypertension 05/16/2011  . Attention deficit hyperactivity disorder (ADHD) 05/16/2011    Past Medical History:  Diagnosis Date  . ADD (attention deficit disorder with hyperactivity)   . Allergy   . Anemia   . Asthma   . Hypertension   . Thyroid disease     Past Surgical History:  Procedure Laterality Date  . TONSILLECTOMY AND ADENOIDECTOMY      Social History   Tobacco Use  . Smoking status: Former Smoker    Quit date: 03/02/2008    Years since quitting: 10.8  . Smokeless tobacco: Never Used  Substance Use Topics  . Alcohol use: Yes    Alcohol/week: 5.0 standard drinks    Types: 5 Standard drinks or equivalent per week  . Drug use: No    Family History  Problem Relation Age of Onset  . Prostate cancer Father     Allergies  Allergen Reactions  . Amoxicillin  Hives  . Penicillins Hives    Medication list has been reviewed and updated.  Current Outpatient Medications on File Prior to Visit  Medication Sig Dispense Refill  . bisoprolol-hydrochlorothiazide (ZIAC) 5-6.25 MG tablet TAKE 1 TABLET BY MOUTH EVERY DAY 90 tablet 1  . levothyroxine (SYNTHROID) 50 MCG tablet TAKE 1 TABLET BY MOUTH EVERY DAY BEFORE BREAKFAST 90 tablet 1  . norethindrone (MICRONOR,CAMILA,ERRIN) 0.35 MG tablet Take 1 tablet by mouth daily.    [place] ofloxacin (FLOXIN) 0.3 % OTIC solution Place 10 drops into the right ear daily. Use for one week for otitis externa 5 mL 0  . fluticasone (FLONASE) 50 MCG/ACT nasal spray Place 2 sprays into both nostrils daily. 16 g 0  . lisdexamfetamine (VYVANSE) 50 MG capsule Take 1 capsule (50 mg total) by mouth every morning. To fill in 60 days (Patient not taking: Reported on 01/12/2019) 30 capsule 0  . lisdexamfetamine (VYVANSE) 50 MG capsule Take 1 capsule (50 mg total) by mouth every morning. To fill in 30 days (Patient not taking: Reported on 01/12/2019) 30 capsule 0  . lisdexamfetamine (VYVANSE) 50 MG capsule Take 1 capsule (50 mg total) by mouth daily. (Patient not taking: Reported on 01/12/2019) 30 capsule 0   No current facility-administered medications on file prior to visit.     Review of Systems:  As per HPI- otherwise negative.  No fever or chills, no chest pain or shortness of breath Physical Examination: Vitals:   01/12/19 1418  BP: 126/90  Pulse: 74  Resp: 16  Temp: (!) 97.5 F (36.4 C)  SpO2: 99%   Vitals:   01/12/19 1418  Weight: 287 lb (130.2 kg)  Height: 5' 3.5" (1.613 m)   Body mass index is 50.04  kg/m. Ideal Body Weight: Weight in (lb) to have BMI = 25: 143.1  GEN: WDWN, NAD, Non-toxic, A & O x 3, obese, looks well HEENT: Atraumatic, Normocephalic. Neck supple. No masses, No LAD.  Both TMs appear intact on exam today Ears and Nose: No external deformity. CV: RRR, No M/G/R. No JVD. No thrill. No extra heart  sounds. PULM: CTA B, no wheezes, crackles, rhonchi. No retractions. No resp. distress. No accessory muscle use. ABD: S, NT, ND, +BS. No rebound. No HSM. EXTR: No c/c/e NEURO Normal gait.  PSYCH: Normally interactive. Conversant. Not depressed or anxious appearing.  Calm demeanor.    Assessment and Plan: Physical exam  Attention deficit disorder, unspecified hyperactivity presence - Plan: Pain Mgmt, Profile 8 w/Conf, U, amphetamine-dextroamphetamine (ADDERALL) 10 MG tablet  Other specified hypothyroidism - Plan: TSH  Screening for hyperlipidemia - Plan: Lipid panel  Essential hypertension - Plan: CBC, Comprehensive metabolic panel  Screening for deficiency anemia - Plan: CBC  Screening for diabetes mellitus - Plan: Hemoglobin A1c  Here today for complete physical Labs pending as above Annual urine drug screen today She would like to try as needed Adderall instead of daily Vyvanse.  Send in prescription for 10 mg.  Advised her to start on a lower dose, 5 mg once or twice a day, until she can see how she reacts to this medication Encourage diet and exercise, with gradual weight loss  Signed Lamar Blinks, Rojas  10/6-received her labs, message to patient  Results for orders placed or performed in visit on 01/12/19  CBC  Result Value Ref Range   WBC 11.5 (H) 4.0 - 10.5 K/uL   RBC 3.94 3.87 - 5.11 Mil/uL   Platelets 444.0 (H) 150.0 - 400.0 K/uL   Hemoglobin 11.7 (L) 12.0 - 15.0 g/dL   HCT 35.6 (L) 36.0 - 46.0 %   MCV 90.4 78.0 - 100.0 fl   MCHC 32.7 30.0 - 36.0 g/dL   RDW 14.5 11.5 - 15.5 %  Comprehensive metabolic panel  Result Value Ref Range   Sodium 137 135 - 145 mEq/L   Potassium 3.8 3.5 - 5.1 mEq/L   Chloride 100 96 - 112 mEq/L   CO2 28 19 - 32 mEq/L   Glucose, Bld 85 70 - 99 mg/dL   BUN 10 6 - 23 mg/dL   Creatinine, Ser 0.78 0.40 - 1.20 mg/dL   Total Bilirubin 0.3 0.2 - 1.2 mg/dL   Alkaline Phosphatase 80 39 - 117 U/L   AST 28 0 - 37 U/L   ALT 31 0 - 35 U/L    Total Protein 7.2 6.0 - 8.3 g/dL   Albumin 4.0 3.5 - 5.2 g/dL   Calcium 9.3 8.4 - 10.5 mg/dL   GFR 82.27 >60.00 mL/min  Hemoglobin A1c  Result Value Ref Range   Hgb A1c MFr Bld 5.6 4.6 - 6.5 %  Lipid panel  Result Value Ref Range   Cholesterol 171 0 - 200 mg/dL   Triglycerides 222.0 (H) 0.0 - 149.0 mg/dL   HDL 46.90 >39.00 mg/dL   VLDL 44.4 (H) 0.0 - 40.0 mg/dL   Total CHOL/HDL Ratio 4    NonHDL 124.41   TSH  Result Value Ref Range   TSH 2.98 0.35 - 4.50 uIU/mL  LDL cholesterol, direct  Result Value Ref Range   Direct LDL 96.0 mg/dL     Your white blood cell count is minimally high.  This was present last year as well.  Given that your elevation  is minor this is probably nothing of concern.  However, since it has been persistent I would suggest that we recheck in about 3 months to follow this a bit more closely.  Minimal anemia is likely due to menstrual blood losses Metabolic profile normal A1c-average blood sugar the previous 3 months-is in normal range Your cholesterol looks fine except for elevated triglycerides.  You mention working some on your diet and exercise routine, this should help lower your triglycerides Thyroid looks fine  I will order a repeat blood count for you.  This can be done at your convenience as a lab appointment only in about 3 months.  Take care

## 2019-01-12 ENCOUNTER — Encounter: Payer: Self-pay | Admitting: Family Medicine

## 2019-01-12 ENCOUNTER — Ambulatory Visit (INDEPENDENT_AMBULATORY_CARE_PROVIDER_SITE_OTHER): Payer: 59 | Admitting: Family Medicine

## 2019-01-12 ENCOUNTER — Other Ambulatory Visit: Payer: Self-pay

## 2019-01-12 ENCOUNTER — Telehealth: Payer: Self-pay

## 2019-01-12 VITALS — BP 126/90 | HR 74 | Temp 97.5°F | Resp 16 | Ht 63.5 in | Wt 287.0 lb

## 2019-01-12 DIAGNOSIS — Z23 Encounter for immunization: Secondary | ICD-10-CM | POA: Diagnosis not present

## 2019-01-12 DIAGNOSIS — F988 Other specified behavioral and emotional disorders with onset usually occurring in childhood and adolescence: Secondary | ICD-10-CM

## 2019-01-12 DIAGNOSIS — E038 Other specified hypothyroidism: Secondary | ICD-10-CM

## 2019-01-12 DIAGNOSIS — D72829 Elevated white blood cell count, unspecified: Secondary | ICD-10-CM

## 2019-01-12 DIAGNOSIS — I10 Essential (primary) hypertension: Secondary | ICD-10-CM | POA: Diagnosis not present

## 2019-01-12 DIAGNOSIS — Z0000 Encounter for general adult medical examination without abnormal findings: Secondary | ICD-10-CM | POA: Diagnosis not present

## 2019-01-12 DIAGNOSIS — Z130 Encounter for screening for diseases of the blood and blood-forming organs and certain disorders involving the immune mechanism: Secondary | ICD-10-CM | POA: Diagnosis not present

## 2019-01-12 DIAGNOSIS — Z13220 Encounter for screening for lipoid disorders: Secondary | ICD-10-CM | POA: Diagnosis not present

## 2019-01-12 DIAGNOSIS — Z131 Encounter for screening for diabetes mellitus: Secondary | ICD-10-CM | POA: Diagnosis not present

## 2019-01-12 MED ORDER — AMPHETAMINE-DEXTROAMPHETAMINE 10 MG PO TABS: 10.0000 mg | ORAL_TABLET | Freq: Two times a day (BID) | ORAL | 0 refills | Status: DC

## 2019-01-12 NOTE — Patient Instructions (Addendum)
It was great to see you again today as always.  You got your flu shot today I will be in touch with your labs as soon as possible Please be sure the lab collects a urine for [place] today as well  I prescribed Adderall 10 mg.  You can take this up to twice a day.  However, when for starting on it I would suggest taking just half a tablet once or twice a day to see how you respond.  I would recommend that you work on diet and exercise, with goal of gradual weight loss. Remember mammogram age 38 Pap is due next year   Health Maintenance, Female Adopting a healthy lifestyle and getting preventive care are important in promoting health and wellness. Ask your health care provider about:  The right schedule for you to have regular tests and exams.  Things you can do on your own to prevent diseases and keep yourself healthy. What should I know about diet, weight, and exercise? Eat a healthy diet   Eat a diet that includes plenty of vegetables, fruits, low-fat dairy products, and lean protein.  Do not eat a lot of foods that are high in solid fats, added sugars, or sodium. Maintain a healthy weight Body mass index (BMI) is used to identify weight problems. It estimates body fat based on height and weight. Your health care provider can help determine your BMI and help you achieve or maintain a healthy weight. Get regular exercise Get regular exercise. This is one of the most important things you can do for your health. Most adults should:  Exercise for at least 150 minutes each week. The exercise should increase your heart rate and make you sweat (moderate-intensity exercise).  Do strengthening exercises at least twice a week. This is in addition to the moderate-intensity exercise.  Spend less time sitting. Even light physical activity can be beneficial. Watch cholesterol and blood lipids Have your blood tested for lipids and cholesterol at 39 years of age, then have this test every 5  years. Have your cholesterol levels checked more often if:  Your lipid or cholesterol levels are high.  You are older than 39 years of age.  You are at high risk for heart disease. What should I know about cancer screening? Depending on your health history and family history, you may need to have cancer screening at various ages. This may include screening for:  Breast cancer.  Cervical cancer.  Colorectal cancer.  Skin cancer.  Lung cancer. What should I know about heart disease, diabetes, and high blood pressure? Blood pressure and heart disease  High blood pressure causes heart disease and increases the risk of stroke. This is more likely to develop in people who have high blood pressure readings, are of African descent, or are overweight.  Have your blood pressure checked: ? Every 3-5 years if you are 24-39 years of age. ? Every year if you are 61 years old or older. Diabetes Have regular diabetes screenings. This checks your fasting blood sugar level. Have the screening done:  Once every three years after age 32 if you are at a normal weight and have a low risk for diabetes.  More often and at a younger age if you are overweight or have a high risk for diabetes. What should I know about preventing infection? Hepatitis B If you have a higher risk for hepatitis B, you should be screened for this virus. Talk with your health care provider to find out  if you are at risk for hepatitis B infection. Hepatitis C Testing is recommended for:  Everyone born from [birth year] through 1965.  Anyone with known risk factors for hepatitis C. Sexually transmitted infections (STIs)  Get screened for STIs, including gonorrhea and chlamydia, if: ? You are sexually active and are younger than 39 years of age. ? You are older than 39 years of age and your health care provider tells you that you are at risk for this type of infection. ? Your sexual activity has changed since you were last  screened, and you are at increased risk for chlamydia or gonorrhea. Ask your health care provider if you are at risk.  Ask your health care provider about whether you are at high risk for HIV. Your health care provider may recommend a prescription medicine to help prevent HIV infection. If you choose to take medicine to prevent HIV, you should first get tested for HIV. You should then be tested every 3 months for as long as you are taking the medicine. Pregnancy  If you are about to stop having your period (premenopausal) and you may become pregnant, seek counseling before you get pregnant.  Take 400 to 800 micrograms (mcg) of folic acid every day if you become pregnant.  Ask for birth control (contraception) if you want to prevent pregnancy. Osteoporosis and menopause Osteoporosis is a disease in which the bones lose minerals and strength with aging. This can result in bone fractures. If you are 66 years old or older, or if you are at risk for osteoporosis and fractures, ask your health care provider if you should:  Be screened for bone loss.  Take a calcium or vitamin D supplement to lower your risk of fractures.  Be given hormone replacement therapy (HRT) to treat symptoms of menopause. Follow these instructions at home: Lifestyle  Do not use any products that contain nicotine or tobacco, such as cigarettes, e-cigarettes, and chewing tobacco. If you need help quitting, ask your health care provider.  Do not use street drugs.  Do not share needles.  Ask your health care provider for help if you need support or information about quitting drugs. Alcohol use  Do not drink alcohol if: ? Your health care provider tells you not to drink. ? You are pregnant, may be pregnant, or are planning to become pregnant.  If you drink alcohol: ? Limit how much you use to 0-1 drink a day. ? Limit intake if you are breastfeeding.  Be aware of how much alcohol is in your drink. In the [place] one  drink equals one 12 oz bottle of beer (355 mL), one 5 oz glass of wine (148 mL), or one 1 oz glass of hard liquor (44 mL). General instructions  Schedule regular health, dental, and eye exams.  Stay current with your vaccines.  Tell your health care provider if: ? You often feel depressed. ? You have ever been abused or do not feel safe at home. Summary  Adopting a healthy lifestyle and getting preventive care are important in promoting health and wellness.  Follow your health care provider's instructions about healthy diet, exercising, and getting tested or screened for diseases.  Follow your health care provider's instructions on monitoring your cholesterol and blood pressure. This information is not intended to replace advice given to you by your health care provider. Make sure you discuss any questions you have with your health care provider. Document Released: 10/09/2010 Document Revised: 03/19/2018 Document Reviewed: 03/19/2018 Elsevier Patient  Education  2020 [employer]  

## 2019-01-12 NOTE — Telephone Encounter (Signed)
PA initiated via Covermymeds; KEY: [id number]. PA approved. Effective 01/12/2019 to 01/11/2022.

## 2019-01-13 ENCOUNTER — Encounter: Payer: Self-pay | Admitting: Family Medicine

## 2019-01-13 LAB — LIPID PANEL
Cholesterol: 171 mg/dL (ref 0–200)
HDL: 46.9 mg/dL (ref >39.00)
NonHDL: 124.41
Total CHOL/HDL Ratio: 4
Triglycerides: 222 mg/dL — ABNORMAL HIGH (ref 0.0–149.0)
VLDL: 44.4 mg/dL — ABNORMAL HIGH (ref 0.0–40.0)

## 2019-01-13 LAB — COMPREHENSIVE METABOLIC PANEL
ALT: 31 U/L (ref 0–35)
AST: 28 U/L (ref 0–37)
Albumin: 4 g/dL (ref 3.5–5.2)
Alkaline Phosphatase: 80 U/L (ref 39–117)
BUN: 10 mg/dL (ref 6–23)
CO2: 28 mEq/L (ref 19–32)
Calcium: 9.3 mg/dL (ref 8.4–10.5)
Chloride: 100 mEq/L (ref 96–112)
Creatinine, Ser: 0.78 mg/dL (ref 0.40–1.20)
GFR: 82.27 mL/min (ref >60.00)
Glucose, Bld: 85 mg/dL (ref 70–99)
Potassium: 3.8 mEq/L (ref 3.5–5.1)
Sodium: 137 mEq/L (ref 135–145)
Total Bilirubin: 0.3 mg/dL (ref 0.2–1.2)
Total Protein: 7.2 g/dL (ref 6.0–8.3)

## 2019-01-13 LAB — CBC
HCT: 35.6 % — ABNORMAL LOW (ref 36.0–46.0)
Hemoglobin: 11.7 g/dL — ABNORMAL LOW (ref 12.0–15.0)
MCHC: 32.7 g/dL (ref 30.0–36.0)
MCV: 90.4 fl (ref 78.0–100.0)
Platelets: 444 10*3/uL — ABNORMAL HIGH (ref 150.0–400.0)
RBC: 3.94 Mil/uL (ref 3.87–5.11)
RDW: 14.5 % (ref 11.5–15.5)
WBC: 11.5 10*3/uL — ABNORMAL HIGH (ref 4.0–10.5)

## 2019-01-13 LAB — LDL CHOLESTEROL, DIRECT: Direct LDL: 96 mg/dL

## 2019-01-13 LAB — HEMOGLOBIN A1C: Hgb A1c MFr Bld: 5.6 % (ref 4.6–6.5)

## 2019-01-13 LAB — TSH: TSH: 2.98 u[IU]/mL (ref 0.35–4.50)

## 2019-01-13 NOTE — Addendum Note (Signed)
Addended by: Lamar Blinks C on: 01/13/2019 03:47 PM   Modules accepted: Orders

## 2019-01-14 LAB — PAIN MGMT, PROFILE 8 W/CONF, U
6 Acetylmorphine: NEGATIVE ng/mL
Alcohol Metabolites: POSITIVE ng/mL — AB (ref <500)
Amphetamines: NEGATIVE ng/mL
Benzodiazepines: NEGATIVE ng/mL
Buprenorphine, Urine: NEGATIVE ng/mL
Cocaine Metabolite: NEGATIVE ng/mL
Creatinine: 77.9 mg/dL
Ethyl Glucuronide (ETG): 46309 ng/mL
Ethyl Sulfate (ETS): 5114 ng/mL
MDMA: NEGATIVE ng/mL
Marijuana Metabolite: NEGATIVE ng/mL
Opiates: NEGATIVE ng/mL
Oxidant: NEGATIVE ug/mL
Oxycodone: NEGATIVE ng/mL
pH: 5.4 (ref 4.5–9.0)

## 2019-03-21 ENCOUNTER — Encounter: Payer: Self-pay | Admitting: Family Medicine

## 2019-03-21 DIAGNOSIS — F988 Other specified behavioral and emotional disorders with onset usually occurring in childhood and adolescence: Secondary | ICD-10-CM

## 2019-03-23 MED ORDER — AMPHETAMINE-DEXTROAMPHETAMINE 10 MG PO TABS: 10.0000 mg | ORAL_TABLET | Freq: Two times a day (BID) | ORAL | 0 refills | Status: DC

## 2019-03-23 NOTE — Addendum Note (Signed)
Addended by: Lamar Blinks C on: 03/23/2019 05:10 PM   Modules accepted: Orders

## 2019-04-10 ENCOUNTER — Encounter: Payer: Self-pay | Admitting: Family Medicine

## 2019-05-23 ENCOUNTER — Other Ambulatory Visit: Payer: Self-pay | Admitting: Family Medicine

## 2019-05-23 DIAGNOSIS — I10 Essential (primary) hypertension: Secondary | ICD-10-CM

## 2019-05-23 DIAGNOSIS — E038 Other specified hypothyroidism: Secondary | ICD-10-CM

## 2019-05-29 ENCOUNTER — Encounter: Payer: Self-pay | Admitting: Family Medicine

## 2019-05-29 DIAGNOSIS — F988 Other specified behavioral and emotional disorders with onset usually occurring in childhood and adolescence: Secondary | ICD-10-CM

## 2019-05-29 MED ORDER — AMPHETAMINE-DEXTROAMPHETAMINE 10 MG PO TABS: 10.0000 mg | ORAL_TABLET | Freq: Two times a day (BID) | ORAL | 0 refills | Status: DC

## 2019-08-30 ENCOUNTER — Encounter: Payer: Self-pay | Admitting: Family Medicine

## 2019-08-30 DIAGNOSIS — F988 Other specified behavioral and emotional disorders with onset usually occurring in childhood and adolescence: Secondary | ICD-10-CM

## 2019-08-31 MED ORDER — AMPHETAMINE-DEXTROAMPHETAMINE 10 MG PO TABS: 10.0000 mg | ORAL_TABLET | Freq: Two times a day (BID) | ORAL | 0 refills | Status: DC

## 2019-08-31 NOTE — Addendum Note (Signed)
Addended by: Abbe Amsterdam C on: 08/31/2019 02:39 PM   Modules accepted: Orders

## 2019-09-09 ENCOUNTER — Encounter: Payer: Self-pay | Admitting: Family Medicine

## 2019-09-09 MED ORDER — AMPHETAMINE-DEXTROAMPHETAMINE 20 MG PO TABS: 10.0000 mg | ORAL_TABLET | Freq: Two times a day (BID) | ORAL | 0 refills | Status: DC

## 2019-10-25 ENCOUNTER — Encounter: Payer: Self-pay | Admitting: Family Medicine

## 2019-10-26 MED ORDER — LISDEXAMFETAMINE DIMESYLATE 50 MG PO CAPS: 50.0000 mg | ORAL_CAPSULE | Freq: Every day | ORAL | 0 refills | Status: DC

## 2019-10-26 NOTE — Addendum Note (Signed)
Addended by: Abbe Amsterdam C on: 10/26/2019 01:08 PM   Modules accepted: Orders

## 2019-12-01 ENCOUNTER — Encounter: Payer: Self-pay | Admitting: Family Medicine

## 2019-12-02 ENCOUNTER — Other Ambulatory Visit: Payer: Self-pay | Admitting: Family Medicine

## 2019-12-02 DIAGNOSIS — I10 Essential (primary) hypertension: Secondary | ICD-10-CM

## 2019-12-02 MED ORDER — LISDEXAMFETAMINE DIMESYLATE 50 MG PO CAPS: 50.0000 mg | ORAL_CAPSULE | Freq: Every day | ORAL | 0 refills | Status: DC

## 2019-12-25 ENCOUNTER — Other Ambulatory Visit: Payer: Self-pay | Admitting: Family Medicine

## 2019-12-25 DIAGNOSIS — I10 Essential (primary) hypertension: Secondary | ICD-10-CM

## 2020-01-09 ENCOUNTER — Encounter: Payer: Self-pay | Admitting: Family Medicine

## 2020-01-09 DIAGNOSIS — I10 Essential (primary) hypertension: Secondary | ICD-10-CM

## 2020-01-10 ENCOUNTER — Other Ambulatory Visit: Payer: Self-pay | Admitting: Family Medicine

## 2020-01-10 DIAGNOSIS — I10 Essential (primary) hypertension: Secondary | ICD-10-CM

## 2020-01-10 DIAGNOSIS — E038 Other specified hypothyroidism: Secondary | ICD-10-CM

## 2020-01-11 MED ORDER — LISDEXAMFETAMINE DIMESYLATE 50 MG PO CAPS: 50.0000 mg | ORAL_CAPSULE | Freq: Every day | ORAL | 0 refills | Status: DC

## 2020-01-11 MED ORDER — BISOPROLOL-HYDROCHLOROTHIAZIDE 5-6.25 MG PO TABS: 1.0000 | ORAL_TABLET | Freq: Every day | ORAL | 3 refills | Status: DC

## 2020-02-02 ENCOUNTER — Other Ambulatory Visit: Payer: Self-pay | Admitting: Family Medicine

## 2020-02-02 DIAGNOSIS — E038 Other specified hypothyroidism: Secondary | ICD-10-CM

## 2020-02-17 ENCOUNTER — Encounter: Payer: Self-pay | Admitting: Family Medicine

## 2020-02-17 MED ORDER — LISDEXAMFETAMINE DIMESYLATE 50 MG PO CAPS
50.0000 mg | ORAL_CAPSULE | Freq: Every day | ORAL | 0 refills | Status: DC
Start: 2020-02-17 — End: 2020-05-16

## 2020-02-17 NOTE — Telephone Encounter (Signed)
Scheduled patient for VV

## 2020-02-18 ENCOUNTER — Other Ambulatory Visit: Payer: Self-pay

## 2020-02-18 ENCOUNTER — Telehealth (INDEPENDENT_AMBULATORY_CARE_PROVIDER_SITE_OTHER): Payer: 59 | Admitting: Family Medicine

## 2020-02-18 DIAGNOSIS — F988 Other specified behavioral and emotional disorders with onset usually occurring in childhood and adolescence: Secondary | ICD-10-CM | POA: Diagnosis not present

## 2020-02-18 DIAGNOSIS — I10 Essential (primary) hypertension: Secondary | ICD-10-CM | POA: Diagnosis not present

## 2020-02-18 DIAGNOSIS — E038 Other specified hypothyroidism: Secondary | ICD-10-CM

## 2020-02-18 MED ORDER — LISDEXAMFETAMINE DIMESYLATE 50 MG PO CAPS: 50.0000 mg | ORAL_CAPSULE | Freq: Every day | ORAL | 0 refills | Status: AC

## 2020-02-18 MED ORDER — LISDEXAMFETAMINE DIMESYLATE 50 MG PO CAPS: 50.0000 mg | ORAL_CAPSULE | Freq: Every day | ORAL | 0 refills | Status: DC

## 2020-02-18 MED ORDER — LEVOTHYROXINE SODIUM 50 MCG PO TABS: 50.0000 ug | ORAL_TABLET | Freq: Every day | ORAL | 0 refills | Status: DC

## 2020-02-18 NOTE — Progress Notes (Signed)
[facility] [address] [place], [place] [address] [phone]  Date:  02/18/2020   Name:  TARAJI MUNGO   DOB:  [birth year]   MRN:  [id number]  PCP:  Pearline Cables, MD    Chief Complaint: No chief complaint on file.   History of Present Illness:  Emma Rojas is a 39 y.o. very pleasant female patient who presents with the following:  Virtual visit today to discuss medication.  Patient is taking Vyvanse 50 mg daily for ADHD Last face-to-face visit was about 1 year ago Jenavee is currently living in [place]- she just bought a home there  Connected with patient via video monitor today.  The patient myself are present on the call today Patient location is home, provider location is office.  Patient identity confirmed with 2 factors, she gives consent for virtual visit today  Also of bisoprolol/HCTZ for blood pressure Levothyroxine Oral contraceptive pill  She is using vyvanse 50 mg- she occasionally misses a day if she is not overly busy  She is not noting any side effects from the medication -sleep and appetite are okay She does not currently have a BP cuff to monitor her blood pressure  COVID-19 vaccine done including booster  Flu shot- done  Pap smear- she needs to schedule this  Most recent labs 1 year ago  She does plan to get a local primary care doctor now that she has settled permanently in the [place] area  [facility] on [address]  Patient Active Problem List   Diagnosis Date Noted  . Thyroid activity decreased 04/20/2014  . BMI 40.0-44.9, adult (HCC) 09/19/2012  . Hypertension 05/16/2011  . Attention deficit hyperactivity disorder (ADHD) 05/16/2011    Past Medical History:  Diagnosis Date  . ADD (attention deficit disorder with hyperactivity)   . Allergy   . Anemia   . Asthma   . Hypertension   . Thyroid disease     Past Surgical History:  Procedure Laterality Date  .  TONSILLECTOMY AND ADENOIDECTOMY      Social History   Tobacco Use  . Smoking status: Former Smoker    Quit date: 03/02/2008    Years since quitting: 11.9  . Smokeless tobacco: Never Used  Substance Use Topics  . Alcohol use: Yes    Alcohol/week: 5.0 standard drinks    Types: 5 Standard drinks or equivalent per week  . Drug use: No    Family History  Problem Relation Age of Onset  . Prostate cancer Father     Allergies  Allergen Reactions  . Amoxicillin Hives  . Penicillins Hives    Medication list has been reviewed and updated.  Current Outpatient Medications on File Prior to Visit  Medication Sig Dispense Refill  . bisoprolol-hydrochlorothiazide (ZIAC) 5-6.25 MG tablet Take 1 tablet by mouth daily. 90 tablet 3  . fluticasone (FLONASE) 50 MCG/ACT nasal spray Place 2 sprays into both nostrils daily. 16 g 0  . levothyroxine (SYNTHROID) 50 MCG tablet TAKE 1 TABLET BY MOUTH EVERY DAY BEFORE BREAKFAST 30 tablet 0  . lisdexamfetamine (VYVANSE) 50 MG capsule Take 1 capsule (50 mg total) by mouth daily. 30 capsule 0  . norethindrone (MICRONOR,CAMILA,ERRIN) 0.35 MG tablet Take 1 tablet by mouth daily.    Marland Kitchen ofloxacin (FLOXIN) 0.3 % OTIC solution Place 10 drops into the right ear daily. Use for one week for otitis externa 5 mL 0   No current  facility-administered medications on file prior to visit.    Review of Systems:  As per HPI- otherwise negative.   Physical Examination: There were no vitals filed for this visit. There were no vitals filed for this visit. There is no height or weight on file to calculate BMI. Ideal Body Weight:    Overweight, appears her normal self.  No distress.  Patient is not currently checking vitals at home  Assessment and Plan: Essential hypertension  Attention deficit disorder, unspecified hyperactivity presence - Plan: lisdexamfetamine (VYVANSE) 50 MG capsule, lisdexamfetamine (VYVANSE) 50 MG capsule  Other specified hypothyroidism -  Plan: levothyroxine (SYNTHROID) 50 MCG tablet     Virtual follow-up visit today, video monitor used for duration of visit. Patient history of hypertension, she has not been seen or had labs in some time.  I did encourage her to establish care with a local primary care doctor or gynecologist at this time-GYN would most likely be able to monitor her mild blood pressure and thyroid concerns  Discussed health maintenance, immunizations  Refilled Vyvanse and levothyroxine  Signed Abbe Amsterdam, MD

## 2020-03-01 ENCOUNTER — Other Ambulatory Visit: Payer: Self-pay | Admitting: Family Medicine

## 2020-03-01 DIAGNOSIS — E038 Other specified hypothyroidism: Secondary | ICD-10-CM

## 2020-03-14 ENCOUNTER — Other Ambulatory Visit: Payer: Self-pay | Admitting: Family Medicine

## 2020-03-14 DIAGNOSIS — E038 Other specified hypothyroidism: Secondary | ICD-10-CM

## 2020-05-16 ENCOUNTER — Encounter: Payer: Self-pay | Admitting: Family Medicine

## 2020-05-16 DIAGNOSIS — F988 Other specified behavioral and emotional disorders with onset usually occurring in childhood and adolescence: Secondary | ICD-10-CM

## 2020-05-16 DIAGNOSIS — E038 Other specified hypothyroidism: Secondary | ICD-10-CM

## 2020-05-16 MED ORDER — LISDEXAMFETAMINE DIMESYLATE 50 MG PO CAPS: 50.0000 mg | ORAL_CAPSULE | Freq: Every day | ORAL | 0 refills | Status: DC

## 2020-05-16 MED ORDER — LISDEXAMFETAMINE DIMESYLATE 50 MG PO CAPS
50.0000 mg | ORAL_CAPSULE | Freq: Every day | ORAL | 0 refills | Status: DC
Start: 2020-05-16 — End: 2020-05-16

## 2020-05-16 MED ORDER — LEVOTHYROXINE SODIUM 50 MCG PO TABS: 50.0000 ug | ORAL_TABLET | Freq: Every day | ORAL | 0 refills | Status: DC

## 2020-05-16 MED ORDER — LISDEXAMFETAMINE DIMESYLATE 50 MG PO CAPS: 50.0000 mg | ORAL_CAPSULE | Freq: Every day | ORAL | 0 refills | Status: AC

## 2020-05-16 NOTE — Addendum Note (Signed)
Addended by: Abbe Amsterdam C on: 05/16/2020 08:24 PM   Modules accepted: Orders

## 2020-06-14 ENCOUNTER — Encounter: Payer: Self-pay | Admitting: Family Medicine

## 2020-06-14 ENCOUNTER — Other Ambulatory Visit: Payer: Self-pay | Admitting: Family Medicine

## 2020-06-14 DIAGNOSIS — E038 Other specified hypothyroidism: Secondary | ICD-10-CM

## 2020-06-29 ENCOUNTER — Telehealth: Payer: Self-pay | Admitting: *Deleted

## 2020-06-29 ENCOUNTER — Encounter: Payer: Self-pay | Admitting: Family Medicine

## 2020-06-29 NOTE — Telephone Encounter (Signed)
Error

## 2020-06-29 NOTE — Telephone Encounter (Signed)
Prior auth submitted for Vyvanse 50mg  and we are awaiting determination.  Cover my med key: 

## 2020-06-29 NOTE — Telephone Encounter (Signed)
As long as you remain covered by your prescription drug plan and there are no changes to your plan benefits, this request is approved for the following time period: 06/29/2020 - 06/29/2023

## 2020-08-01 ENCOUNTER — Encounter: Payer: Self-pay | Admitting: Family Medicine

## 2020-08-01 DIAGNOSIS — F988 Other specified behavioral and emotional disorders with onset usually occurring in childhood and adolescence: Secondary | ICD-10-CM

## 2020-08-01 MED ORDER — LISDEXAMFETAMINE DIMESYLATE 50 MG PO CAPS: 50.0000 mg | ORAL_CAPSULE | Freq: Every day | ORAL | 0 refills | Status: DC

## 2020-09-09 ENCOUNTER — Encounter: Payer: Self-pay | Admitting: Family Medicine

## 2020-09-09 DIAGNOSIS — F988 Other specified behavioral and emotional disorders with onset usually occurring in childhood and adolescence: Secondary | ICD-10-CM

## 2020-09-09 MED ORDER — LISDEXAMFETAMINE DIMESYLATE 50 MG PO CAPS: 50.0000 mg | ORAL_CAPSULE | Freq: Every day | ORAL | 0 refills | Status: DC

## 2020-10-12 ENCOUNTER — Encounter: Payer: Self-pay | Admitting: Family Medicine

## 2020-10-12 DIAGNOSIS — F988 Other specified behavioral and emotional disorders with onset usually occurring in childhood and adolescence: Secondary | ICD-10-CM

## 2020-10-12 MED ORDER — LISDEXAMFETAMINE DIMESYLATE 50 MG PO CAPS: 50.0000 mg | ORAL_CAPSULE | Freq: Every day | ORAL | 0 refills | Status: AC

## 2021-01-29 ENCOUNTER — Other Ambulatory Visit: Payer: Self-pay | Admitting: Family Medicine

## 2021-01-29 DIAGNOSIS — I10 Essential (primary) hypertension: Secondary | ICD-10-CM

## 2021-02-24 ENCOUNTER — Other Ambulatory Visit: Payer: Self-pay | Admitting: Family Medicine

## 2021-02-24 DIAGNOSIS — I10 Essential (primary) hypertension: Secondary | ICD-10-CM

## 2021-03-26 ENCOUNTER — Other Ambulatory Visit: Payer: Self-pay | Admitting: Family Medicine

## 2021-03-26 DIAGNOSIS — I10 Essential (primary) hypertension: Secondary | ICD-10-CM
